# Patient Record
Sex: Male | Born: 1980 | Race: White | Hispanic: No | Marital: Married | Smoking: Never smoker
Health system: Southern US, Community
[De-identification: ages and names within clinical notes are randomized; demographics above are authoritative.]

## PROBLEM LIST (undated history)

## (undated) DIAGNOSIS — N289 Disorder of kidney and ureter, unspecified: Secondary | ICD-10-CM

## (undated) DIAGNOSIS — Z87442 Personal history of urinary calculi: Secondary | ICD-10-CM

## (undated) HISTORY — PX: WISDOM TOOTH EXTRACTION: SHX21

## (undated) HISTORY — PX: TONSILLECTOMY: SUR1361

---
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
## 2007-01-19 ENCOUNTER — Emergency Department (HOSPITAL_COMMUNITY): Admission: EM | Admit: 2007-01-19 | Discharge: 2007-01-19 | Payer: Self-pay | Admitting: Emergency Medicine

## 2009-04-11 ENCOUNTER — Emergency Department (HOSPITAL_COMMUNITY): Admission: EM | Admit: 2009-04-11 | Discharge: 2009-04-11 | Payer: Self-pay | Admitting: Emergency Medicine

## 2013-04-21 ENCOUNTER — Emergency Department (HOSPITAL_COMMUNITY)
Admission: EM | Admit: 2013-04-21 | Discharge: 2013-04-21 | Disposition: A | Discharge status: Home / Self Care | Payer: 59 | Attending: Emergency Medicine | Admitting: Emergency Medicine

## 2013-04-21 ENCOUNTER — Encounter (HOSPITAL_COMMUNITY): Payer: Self-pay | Admitting: Emergency Medicine

## 2013-04-21 DIAGNOSIS — Y9389 Activity, other specified: Secondary | ICD-10-CM | POA: Insufficient documentation

## 2013-04-21 DIAGNOSIS — L089 Local infection of the skin and subcutaneous tissue, unspecified: Secondary | ICD-10-CM | POA: Insufficient documentation

## 2013-04-21 DIAGNOSIS — L02419 Cutaneous abscess of limb, unspecified: Secondary | ICD-10-CM | POA: Insufficient documentation

## 2013-04-21 DIAGNOSIS — W57XXXA Bitten or stung by nonvenomous insect and other nonvenomous arthropods, initial encounter: Secondary | ICD-10-CM

## 2013-04-21 DIAGNOSIS — L03116 Cellulitis of left lower limb: Secondary | ICD-10-CM

## 2013-04-21 DIAGNOSIS — Y929 Unspecified place or not applicable: Secondary | ICD-10-CM | POA: Insufficient documentation

## 2013-04-21 MED ORDER — SULFAMETHOXAZOLE-TMP DS 800-160 MG PO TABS
1.0000 | ORAL_TABLET | Freq: Once | ORAL | Status: AC
  Administered 2013-04-21: 1 via ORAL
  Filled 2013-04-21: qty 1

## 2013-04-21 MED ORDER — SULFAMETHOXAZOLE-TRIMETHOPRIM 800-160 MG PO TABS: 1.0000 | ORAL_TABLET | Freq: Two times a day (BID) | ORAL | Status: DC

## 2013-04-21 MED ORDER — NAPROXEN 500 MG PO TABS: 500.0000 mg | ORAL_TABLET | Freq: Two times a day (BID) | ORAL | Status: DC

## 2013-04-21 MED ORDER — BACITRACIN ZINC 500 UNIT/GM EX OINT
TOPICAL_OINTMENT | CUTANEOUS | Status: AC
  Filled 2013-04-21: qty 0.9

## 2013-04-21 MED ORDER — NAPROXEN 250 MG PO TABS
500.0000 mg | ORAL_TABLET | Freq: Once | ORAL | Status: AC
  Administered 2013-04-21: 500 mg via ORAL
  Filled 2013-04-21: qty 2

## 2013-04-21 NOTE — ED Notes (Signed)
Pt states he believes he was bitten by a spider 3-4 days ago. Pt has bite to the left inside of his calf that is red, swollen and warm to the touch and is oozing.

## 2013-04-21 NOTE — ED Provider Notes (Signed)
CSN: [id number]     Arrival date & time 04/21/13  2036 History   First MD Initiated Contact with Patient 04/21/13 2049     Chief Complaint  Patient presents with  . Insect Bite   (Consider location/radiation/quality/duration/timing/severity/associated sxs/prior Treatment) HPI Comments: 32 year old male who states that approximately 3-4 days ago he was bitten on the medial left calf by an insect. He did not see this but over the last several days has had increased swelling and today mashed over this area causing a small amount of purulent material to come out. Over the last 24 hours he has also developed some erythema to the skin with warmth and pain with walking. He denies fevers, worse with ambulation, no medications prior to arrival. He denies a history of diabetes or frequent infections.  The history is provided by the patient and a relative.    History reviewed. No pertinent past medical history. Past Surgical History  Procedure Laterality Date  . Tonsillectomy    . Wisdom tooth extraction     History reviewed. No pertinent family history. History  Substance Use Topics  . Smoking status: Never Smoker   . Smokeless tobacco: Not on file  . Alcohol Use: No    Review of Systems  Constitutional: Negative for fever.  Skin: Positive for rash and wound.    Allergies  Review of patient's allergies indicates no known allergies.  Home Medications   Current Outpatient Rx  Name  Route  Sig  Dispense  Refill  . naproxen (NAPROSYN) 500 MG tablet   Oral   Take 1 tablet (500 mg total) by mouth 2 (two) times daily with a meal.   30 tablet   0   . sulfamethoxazole-trimethoprim (SEPTRA DS) 800-160 MG per tablet   Oral   Take 1 tablet by mouth every 12 (twelve) hours.   20 tablet   0    BP 130/75  Pulse 85  Temp(Src) 98.9 F (37.2 C)  Resp 20  Ht 6' (1.829 m)  Wt 170 lb (77.111 kg)  BMI 23.05 kg/m2  SpO2 99% Physical Exam  Nursing note and vitals  reviewed. Constitutional: He appears well-developed and well-nourished. No distress.  HENT:  Head: Normocephalic and atraumatic.  Eyes: Conjunctivae are normal. Right eye exhibits no discharge. Left eye exhibits no discharge. No scleral icterus.  Cardiovascular: Normal rate and regular rhythm.   No murmur heard. Pulmonary/Chest: Effort normal and breath sounds normal.  Musculoskeletal: He exhibits tenderness ( Tenderness over the left medial calf). He exhibits no edema.  Skin: Skin is warm and dry. Rash noted. He is not diaphoretic. There is erythema.     Central area of opening with a small amount of serous fluid, no purulence, no induration    ED Course  Procedures (including critical care time) Labs Review Labs Reviewed - No data to display Imaging Review No results found.  EKG Interpretation   None       MDM   1. Cellulitis of left lower leg   2. Insect bite    I performed a bedside ultrasound in this area over the left medial calf there is no signs of abscess or fluid collection, he appears to have expressed the purulent material from the wound but there is no longer anything in there for incision and drainage. I have encouraged him to do warm soaks, antibiotics, will start on medications here, stable for discharge with followup in 24 hours for a recheck. Nursing to outline with permanent marker  prior to discharge.   Meds given in ED:  Medications  sulfamethoxazole-trimethoprim (BACTRIM DS) 800-160 MG per tablet 1 tablet (not administered)  naproxen (NAPROSYN) tablet 500 mg (not administered)    New Prescriptions   NAPROXEN (NAPROSYN) 500 MG TABLET    Take 1 tablet (500 mg total) by mouth 2 (two) times daily with a meal.   SULFAMETHOXAZOLE-TRIMETHOPRIM (SEPTRA DS) 800-160 MG PER TABLET    Take 1 tablet by mouth every 12 (twelve) hours.        Vida Roller, MD 04/21/13 2104

## 2014-03-20 ENCOUNTER — Encounter (HOSPITAL_COMMUNITY): Payer: Self-pay | Admitting: Emergency Medicine

## 2014-03-20 ENCOUNTER — Emergency Department (HOSPITAL_COMMUNITY): Payer: 59

## 2014-03-20 ENCOUNTER — Emergency Department (HOSPITAL_COMMUNITY)
Admission: EM | Admit: 2014-03-20 | Discharge: 2014-03-20 | Disposition: A | Discharge status: Home / Self Care | Payer: 59 | Attending: Emergency Medicine | Admitting: Emergency Medicine

## 2014-03-20 DIAGNOSIS — Z87442 Personal history of urinary calculi: Secondary | ICD-10-CM | POA: Diagnosis not present

## 2014-03-20 DIAGNOSIS — N509 Disorder of male genital organs, unspecified: Secondary | ICD-10-CM | POA: Diagnosis not present

## 2014-03-20 DIAGNOSIS — R112 Nausea with vomiting, unspecified: Secondary | ICD-10-CM | POA: Diagnosis not present

## 2014-03-20 DIAGNOSIS — Z79899 Other long term (current) drug therapy: Secondary | ICD-10-CM | POA: Diagnosis not present

## 2014-03-20 DIAGNOSIS — N201 Calculus of ureter: Secondary | ICD-10-CM | POA: Diagnosis not present

## 2014-03-20 DIAGNOSIS — R109 Unspecified abdominal pain: Secondary | ICD-10-CM | POA: Insufficient documentation

## 2014-03-20 LAB — BASIC METABOLIC PANEL
Anion gap: 12 (ref 5–15)
BUN: 19 mg/dL (ref 6–23)
CHLORIDE: 104 meq/L (ref 96–112)
CO2: 25 mEq/L (ref 19–32)
Calcium: 8.9 mg/dL (ref 8.4–10.5)
Creatinine, Ser: 1.12 mg/dL (ref 0.50–1.35)
GFR calc Af Amer: >90 mL/min (ref >90)
GFR, EST NON AFRICAN AMERICAN: 86 mL/min — AB (ref >90)
GLUCOSE: 142 mg/dL — AB (ref 70–99)
POTASSIUM: 3.9 meq/L (ref 3.7–5.3)
SODIUM: 141 meq/L (ref 137–147)

## 2014-03-20 LAB — URINE MICROSCOPIC-ADD ON

## 2014-03-20 LAB — CBC WITH DIFFERENTIAL/PLATELET
Basophils Absolute: 0 10*3/uL (ref 0.0–0.1)
Basophils Relative: 0 % (ref 0–1)
Eosinophils Absolute: 0 10*3/uL (ref 0.0–0.7)
Eosinophils Relative: 0 % (ref 0–5)
HEMATOCRIT: 44 % (ref 39.0–52.0)
HEMOGLOBIN: 15.5 g/dL (ref 13.0–17.0)
LYMPHS ABS: 1.6 10*3/uL (ref 0.7–4.0)
LYMPHS PCT: 16 % (ref 12–46)
MCH: 30.4 pg (ref 26.0–34.0)
MCHC: 35.2 g/dL (ref 30.0–36.0)
MCV: 86.3 fL (ref 78.0–100.0)
MONO ABS: 0.5 10*3/uL (ref 0.1–1.0)
MONOS PCT: 5 % (ref 3–12)
NEUTROS ABS: 7.6 10*3/uL (ref 1.7–7.7)
NEUTROS PCT: 79 % — AB (ref 43–77)
Platelets: 147 10*3/uL — ABNORMAL LOW (ref 150–400)
RBC: 5.1 MIL/uL (ref 4.22–5.81)
RDW: 12.9 % (ref 11.5–15.5)
WBC: 9.7 10*3/uL (ref 4.0–10.5)

## 2014-03-20 LAB — URINALYSIS, ROUTINE W REFLEX MICROSCOPIC
BILIRUBIN URINE: NEGATIVE
GLUCOSE, UA: NEGATIVE mg/dL
Ketones, ur: NEGATIVE mg/dL
Leukocytes, UA: NEGATIVE
Nitrite: NEGATIVE
PH: 6.5 (ref 5.0–8.0)
SPECIFIC GRAVITY, URINE: 1.02 (ref 1.005–1.030)
UROBILINOGEN UA: 0.2 mg/dL (ref 0.0–1.0)

## 2014-03-20 MED ORDER — SODIUM CHLORIDE 0.9 % IV BOLUS (SEPSIS)
500.0000 mL | Freq: Once | INTRAVENOUS | Status: AC
  Administered 2014-03-20: 06:00:00 via INTRAVENOUS

## 2014-03-20 MED ORDER — ONDANSETRON 4 MG PO TBDP: 4.0000 mg | ORAL_TABLET | Freq: Three times a day (TID) | ORAL | Status: DC | PRN

## 2014-03-20 MED ORDER — OXYCODONE-ACETAMINOPHEN 5-325 MG PO TABS
2.0000 | ORAL_TABLET | Freq: Once | ORAL | Status: AC
  Administered 2014-03-20: 2 via ORAL
  Filled 2014-03-20: qty 2

## 2014-03-20 MED ORDER — HYDROMORPHONE HCL 1 MG/ML IJ SOLN
1.0000 mg | Freq: Once | INTRAMUSCULAR | Status: AC
  Administered 2014-03-20: 1 mg via INTRAVENOUS
  Filled 2014-03-20: qty 1

## 2014-03-20 MED ORDER — ONDANSETRON HCL 4 MG/2ML IJ SOLN
4.0000 mg | Freq: Once | INTRAMUSCULAR | Status: AC
  Administered 2014-03-20: 4 mg via INTRAVENOUS
  Filled 2014-03-20: qty 2

## 2014-03-20 MED ORDER — IBUPROFEN 800 MG PO TABS: 800.0000 mg | ORAL_TABLET | Freq: Three times a day (TID) | ORAL | Status: DC | PRN

## 2014-03-20 MED ORDER — TAMSULOSIN HCL 0.4 MG PO CAPS: 0.4000 mg | ORAL_CAPSULE | Freq: Every day | ORAL | Status: DC

## 2014-03-20 MED ORDER — OXYCODONE-ACETAMINOPHEN 5-325 MG PO TABS: 1.0000 | ORAL_TABLET | ORAL | Status: DC | PRN

## 2014-03-20 MED ORDER — KETOROLAC TROMETHAMINE 30 MG/ML IJ SOLN
30.0000 mg | Freq: Once | INTRAMUSCULAR | Status: AC
  Administered 2014-03-20: 30 mg via INTRAVENOUS
  Filled 2014-03-20: qty 1

## 2014-03-20 NOTE — ED Provider Notes (Signed)
8:00 AM  Assumed care from Dr. Rubin Payor. Patient is here with left flank pain. CT scan shows a 3 mm left UVJ stone. There is mild left hydroureteronephrosis. Labs are unremarkable.  Urine pending.   8:20 AM  Pt's pain is well-controlled. Urine shows no sign of infection. There is large hemoglobin and bacteria but no leukocytes or nitrites. We'll discharge home with prescription for Percocet, ibuprofen, Flomax, Zofran. We'll give urology outpatient followup information and a urine strainer. Discussed return precautions. He verbalizes understanding and is comfortable with plan.  Layla Maw Reynolds Kittel, DO 03/20/14 [phone]

## 2014-03-20 NOTE — ED Notes (Signed)
Patient states flank pain that started at 0200. Patient c/o not being able to urinate and left flank pain. Patient states history of kidney stones and feels that this is the same pain. Patient is diaphoretic and pail. Patient states vomiting PTA. A&OX4. Family at bedside.

## 2014-03-20 NOTE — Discharge Instructions (Signed)

## 2014-03-20 NOTE — ED Notes (Signed)
MD at bedside. 

## 2014-03-20 NOTE — ED Notes (Signed)
Pt alert & oriented x4, stable gait. Patient given discharge instructions, paperwork & prescription(s). Patient  instructed to stop at the registration desk to finish any additional paperwork. Patient verbalized understanding. Pt left department w/ no further questions. 

## 2014-03-20 NOTE — ED Notes (Signed)
Pt c/o left sided flank pain extending across abdomen. Denies any hematuria. C/o N/V. States it feels similar to kidney stones in the past.

## 2014-03-20 NOTE — ED Provider Notes (Signed)
CSN: [id number]     Arrival date & time 03/20/14  0516 History   First MD Initiated Contact with Patient 03/20/14 [phone]     Chief Complaint  Patient presents with  . Flank Pain     (Consider location/radiation/quality/duration/timing/severity/associated sxs/prior Treatment) Patient is a 33 y.o. male presenting with flank pain. The history is provided by the patient.  Flank Pain This is a new problem. Pertinent negatives include no chest pain, no abdominal pain, no headaches and no shortness of breath.   patient awoke with severe left flank/abdominal pain. States goes all the way down to his testicle. He has had nausea and some vomiting. No fevers. He has a history kidney stones and states this feels like that, but more severe. No diarrhea. No constipation. No penile discharge. No urinary symptoms.  Past Medical History  Diagnosis Date  . Kidney stones    Past Surgical History  Procedure Laterality Date  . Tonsillectomy    . Wisdom tooth extraction     No family history on file. History  Substance Use Topics  . Smoking status: Never Smoker   . Smokeless tobacco: Not on file  . Alcohol Use: No    Review of Systems  Constitutional: Negative for activity change and appetite change.  Eyes: Negative for pain.  Respiratory: Negative for chest tightness and shortness of breath.   Cardiovascular: Negative for chest pain and leg swelling.  Gastrointestinal: Positive for nausea and vomiting. Negative for abdominal pain and diarrhea.  Genitourinary: Positive for flank pain and testicular pain.  Musculoskeletal: Negative for back pain and neck stiffness.  Skin: Negative for rash.  Neurological: Negative for weakness, numbness and headaches.  Psychiatric/Behavioral: Negative for behavioral problems.      Allergies  Review of patient's allergies indicates no known allergies.  Home Medications   Prior to Admission medications   Medication Sig Start Date End Date Taking?  Authorizing Provider  naproxen (NAPROSYN) 500 MG tablet Take 1 tablet (500 mg total) by mouth 2 (two) times daily with a meal. 04/21/13   Vida Roller, MD  sulfamethoxazole-trimethoprim (SEPTRA DS) 800-160 MG per tablet Take 1 tablet by mouth every 12 (twelve) hours. 04/21/13   Vida Roller, MD   BP 131/91  Pulse 68  Temp(Src) 97.8 F (36.6 C) (Oral)  Resp 22  Ht  (1.778 m)  Wt 180 lb (81.647 kg)  BMI 25.83 kg/m2  SpO2 100% Physical Exam  Nursing note and vitals reviewed. Constitutional: He is oriented to person, place, and time. He appears well-developed and well-nourished.  Patient appears to be in pain  HENT:  Head: Normocephalic and atraumatic.  Eyes: EOM are normal. Pupils are equal, round, and reactive to light.  Neck: Normal range of motion. Neck supple.  Cardiovascular: Normal rate, regular rhythm and normal heart sounds.   No murmur heard. Pulmonary/Chest: Effort normal and breath sounds normal.  Abdominal: Soft. He exhibits no distension and no mass. There is tenderness. There is no rebound and no guarding.  Moderate left-sided abdominal tenderness. Some guarding. No hernias palpated  Genitourinary: Penis normal.  Mild testicular tenderness on left. No hernias palpated.  Musculoskeletal: Normal range of motion. He exhibits no edema.  Neurological: He is alert and oriented to person, place, and time. No cranial nerve deficit.  Skin: Skin is warm and dry.  Psychiatric: He has a normal mood and affect.    ED Course  Procedures (including critical care time) Labs Review Labs Reviewed  CBC WITH DIFFERENTIAL -  Abnormal; Notable for the following:    Platelets 147 (*)    Neutrophils Relative % 79 (*)    All other components within normal limits  BASIC METABOLIC PANEL - Abnormal; Notable for the following:    Glucose, Bld 142 (*)    GFR calc non Af Amer 86 (*)    All other components within normal limits  URINALYSIS, ROUTINE W REFLEX MICROSCOPIC    Imaging  Review Ct Abdomen Pelvis Wo Contrast  03/20/2014   CLINICAL DATA:  LEFT flank pain, nausea and vomiting. History kidney stones.  EXAM: CT ABDOMEN AND PELVIS WITHOUT CONTRAST  TECHNIQUE: Multidetector CT imaging of the abdomen and pelvis was performed following the standard protocol without IV contrast.  COMPARISON:  Abdominal radiograph May 14, 2008 and CT of the abdomen and pelvis February 10, 2006  FINDINGS: LUNG BASES: Included view of the lung bases are clear. The visualized heart and pericardium are unremarkable.  KIDNEYS/BLADDER: Kidneys are orthotopic, demonstrating normal size and morphology. 4 mm right interpolar, 3 mm LEFT interpolar nephrolithiasis. Mild LEFT hydroureteronephrosis to the ureterovesicular junction where a 3 mm calculus is seen. Limited assessment for renal masses on this nonenhanced examination. Urinary bladder is decompressed and otherwise unremarkable.  SOLID ORGANS: The liver, spleen, gallbladder, pancreas and adrenal glands are unremarkable for this non-contrast examination.  GASTROINTESTINAL TRACT: The stomach, small and large bowel are normal in course and caliber without inflammatory changes, the sensitivity may be decreased by lack of enteric contrast. Normal appendix.  PERITONEUM/RETROPERITONEUM: No intraperitoneal free fluid nor free air. Aortoiliac vessels are normal in course and caliber. No lymphadenopathy by CT size criteria. Internal reproductive organs are unremarkable.  SOFT TISSUES/ OSSEOUS STRUCTURES:  Nonsuspicious.  IMPRESSION: Mild LEFT hydroureteronephrosis to the level lead ureterovesicular junction where a 3 mm calculus is seen. Bilateral residual nephrolithiasis measuring up to 4 mm on the RIGHT.   Electronically Signed   By: Awilda Metro   On: 03/20/2014 06:23     EKG Interpretation None      MDM   Final diagnoses:  Left ureteral stone    Patient with left ureteral stone. History of previous stones. 3 mm at left UVJ. Pain is somewhat  improved after Dilaudid and Toradol has been added. Urinalysis pending    Juliet Rude. Rubin Payor, MD 03/20/14 [phone]

## 2014-11-17 ENCOUNTER — Emergency Department (HOSPITAL_COMMUNITY)
Admission: EM | Admit: 2014-11-17 | Discharge: 2014-11-17 | Disposition: A | Discharge status: Home / Self Care | Payer: No Typology Code available for payment source | Attending: Emergency Medicine | Admitting: Emergency Medicine

## 2014-11-17 ENCOUNTER — Encounter (HOSPITAL_COMMUNITY): Payer: Self-pay | Admitting: Emergency Medicine

## 2014-11-17 DIAGNOSIS — Z791 Long term (current) use of non-steroidal anti-inflammatories (NSAID): Secondary | ICD-10-CM | POA: Insufficient documentation

## 2014-11-17 DIAGNOSIS — Y9289 Other specified places as the place of occurrence of the external cause: Secondary | ICD-10-CM | POA: Insufficient documentation

## 2014-11-17 DIAGNOSIS — S0501XA Injury of conjunctiva and corneal abrasion without foreign body, right eye, initial encounter: Secondary | ICD-10-CM | POA: Insufficient documentation

## 2014-11-17 DIAGNOSIS — H578 Other specified disorders of eye and adnexa: Secondary | ICD-10-CM | POA: Diagnosis present

## 2014-11-17 DIAGNOSIS — H18891 Other specified disorders of cornea, right eye: Secondary | ICD-10-CM

## 2014-11-17 DIAGNOSIS — X58XXXA Exposure to other specified factors, initial encounter: Secondary | ICD-10-CM | POA: Insufficient documentation

## 2014-11-17 DIAGNOSIS — Z87442 Personal history of urinary calculi: Secondary | ICD-10-CM | POA: Diagnosis not present

## 2014-11-17 DIAGNOSIS — Y9389 Activity, other specified: Secondary | ICD-10-CM | POA: Diagnosis not present

## 2014-11-17 DIAGNOSIS — Y998 Other external cause status: Secondary | ICD-10-CM | POA: Diagnosis not present

## 2014-11-17 DIAGNOSIS — Z79899 Other long term (current) drug therapy: Secondary | ICD-10-CM | POA: Diagnosis not present

## 2014-11-17 MED ORDER — HYDROCODONE-ACETAMINOPHEN 5-325 MG PO TABS
2.0000 | ORAL_TABLET | Freq: Once | ORAL | Status: AC
  Administered 2014-11-17: 2 via ORAL
  Filled 2014-11-17: qty 2

## 2014-11-17 MED ORDER — ERYTHROMYCIN 5 MG/GM OP OINT
TOPICAL_OINTMENT | Freq: Four times a day (QID) | OPHTHALMIC | Status: DC
  Administered 2014-11-17: 03:00:00 via OPHTHALMIC
  Filled 2014-11-17: qty 3.5

## 2014-11-17 MED ORDER — HYDROCODONE-ACETAMINOPHEN 5-325 MG PO TABS: 1.0000 | ORAL_TABLET | Freq: Four times a day (QID) | ORAL | Status: DC | PRN

## 2014-11-17 MED ORDER — TETRACAINE HCL 0.5 % OP SOLN
OPHTHALMIC | Status: AC
  Filled 2014-11-17: qty 2

## 2014-11-17 MED ORDER — FLUORESCEIN SODIUM 1 MG OP STRP
ORAL_STRIP | OPHTHALMIC | Status: AC
  Filled 2014-11-17: qty 1

## 2014-11-17 NOTE — ED Notes (Signed)
Patient states he feels as if something is in right eye. Complaining of pain and redness to right eye. Also reports is having trouble closing right eye. 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

## 2014-11-17 NOTE — ED Notes (Signed)
Discharge instructions given and reviewed with patient.  Erythromycin eye ointment and Vicodin prepack given to patient.  Patient verbalized understanding of sedating effects of medication and to use ointment as directed.  Patient ambulatory; discharged home in good condition.

## 2014-11-17 NOTE — ED Provider Notes (Signed)
CSN: [id number]     Arrival date & time 11/17/14  0215 History   First MD Initiated Contact with Patient 11/17/14 0239     Chief Complaint  Patient presents with  . 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 Despite irrigation he still has a sensation of a foreign body in his eye. His right eye is red and he is having some trouble closing his right eye due to discomfort. Pain is moderate. There is some mild blurred vision.  Past Medical History  Diagnosis Date  . Kidney stones    Past Surgical History  Procedure Laterality Date  . Tonsillectomy    . Wisdom tooth extraction     History reviewed. No pertinent family history. History  Substance Use Topics  . Smoking status: Never Smoker   . Smokeless tobacco: Not on file  . Alcohol Use: No    Review of Systems  All other systems reviewed and are negative.   Allergies  Review of patient's allergies indicates no known allergies.  Home Medications   Prior to Admission medications   Medication Sig Start Date End Date Taking? Authorizing Provider  ibuprofen (ADVIL,MOTRIN) 800 MG tablet Take 1 tablet (800 mg total) by mouth every 8 (eight) hours as needed for mild pain. 03/20/14   Kristen N Ward, DO  naproxen (NAPROSYN) 500 MG tablet Take 1 tablet (500 mg total) by mouth 2 (two) times daily with a meal. 04/21/13   Eber HongBrian Miller, MD  ondansetron (ZOFRAN ODT) 4 MG disintegrating tablet Take 1 tablet (4 mg total) by mouth every 8 (eight) hours as needed for nausea or vomiting. 03/20/14   Layla MawKristen N Ward, DO  oxyCODONE-acetaminophen (PERCOCET/ROXICET) 5-325 MG per tablet Take 1-2 tablets by mouth every 4 (four) hours as needed. 03/20/14   Kristen N Ward, DO  sulfamethoxazole-trimethoprim (SEPTRA DS) 800-160 MG per tablet Take 1 tablet by mouth every 12 (twelve)  hours. 04/21/13   Eber HongBrian Miller, MD  tamsulosin (FLOMAX) 0.4 MG CAPS capsule Take 1 capsule (0.4 mg total) by mouth daily. 03/20/14   Kristen N Ward, DO   BP 125/87 mmHg  Pulse 75  Temp(Src) 98.3 F (36.8 C) (Oral)  Resp 20  Ht 6' (1.829 m)  Wt 185 lb (83.915 kg)  BMI 25.08 kg/m2  SpO2 99%   Physical Exam  General: Well-developed, well-nourished male in no acute distress; appearance consistent with age of record HENT: normocephalic; atraumatic Eyes: pupils equal, round and reactive to light; extraocular muscles intact; right conjunctival injection; corneal defect consistent with removed foreign body seen inferior to the pupil, rust ring remains Neck: supple Heart: regular rate and rhythm Lungs: Normal respiratory effort and excursion Abdomen: soft; nondistended Extremities: No deformity; full range of motion Neurologic: Awake, alert and oriented; motor function intact in all extremities and symmetric; no facial droop Skin: Warm and dry Psychiatric: Normal mood and affect    ED Course  Procedures (including critical care time)   MDM  We'll treat patient symptomatically and refer to ophthalmology. He was advised that he needs to have the rust ring removed by a specialist.    Paula LibraJohn Marijke Guadiana, MD 11/17/14 [phone]

## 2014-11-19 MED FILL — Hydrocodone-Acetaminophen Tab 5-325 MG: ORAL | Qty: 6 | Status: AC

## 2016-09-09 ENCOUNTER — Encounter (HOSPITAL_COMMUNITY): Payer: Self-pay | Admitting: Emergency Medicine

## 2016-09-09 ENCOUNTER — Encounter (HOSPITAL_COMMUNITY)
Admission: EM | Disposition: A | Discharge status: Home / Self Care | Payer: Self-pay | Source: Home / Self Care | Attending: Emergency Medicine

## 2016-09-09 ENCOUNTER — Emergency Department (HOSPITAL_COMMUNITY): Payer: 59

## 2016-09-09 ENCOUNTER — Observation Stay (HOSPITAL_COMMUNITY)
Admission: EM | Admit: 2016-09-09 | Discharge: 2016-09-11 | Disposition: A | Discharge status: Home / Self Care | Payer: 59 | Attending: Urology | Admitting: Urology

## 2016-09-09 ENCOUNTER — Emergency Department (HOSPITAL_COMMUNITY): Payer: 59 | Admitting: Certified Registered"

## 2016-09-09 DIAGNOSIS — N132 Hydronephrosis with renal and ureteral calculous obstruction: Secondary | ICD-10-CM | POA: Diagnosis not present

## 2016-09-09 DIAGNOSIS — Z87442 Personal history of urinary calculi: Secondary | ICD-10-CM | POA: Insufficient documentation

## 2016-09-09 DIAGNOSIS — R3915 Urgency of urination: Secondary | ICD-10-CM | POA: Insufficient documentation

## 2016-09-09 DIAGNOSIS — R319 Hematuria, unspecified: Secondary | ICD-10-CM

## 2016-09-09 DIAGNOSIS — N201 Calculus of ureter: Secondary | ICD-10-CM | POA: Diagnosis present

## 2016-09-09 DIAGNOSIS — N390 Urinary tract infection, site not specified: Secondary | ICD-10-CM

## 2016-09-09 HISTORY — PX: CYSTOSCOPY W/ URETERAL STENT PLACEMENT: SHX1429

## 2016-09-09 LAB — BASIC METABOLIC PANEL
Anion gap: 12 (ref 5–15)
BUN: 12 mg/dL (ref 6–20)
CALCIUM: 9.5 mg/dL (ref 8.9–10.3)
CO2: 24 mmol/L (ref 22–32)
CREATININE: 1.19 mg/dL (ref 0.61–1.24)
Chloride: 102 mmol/L (ref 101–111)
GFR calc Af Amer: >60 mL/min (ref >60)
GLUCOSE: 153 mg/dL — AB (ref 65–99)
POTASSIUM: 3.2 mmol/L — AB (ref 3.5–5.1)
SODIUM: 138 mmol/L (ref 135–145)

## 2016-09-09 LAB — CBC WITH DIFFERENTIAL/PLATELET
Basophils Absolute: 0 10*3/uL (ref 0.0–0.1)
Basophils Relative: 0 %
EOS ABS: 0 10*3/uL (ref 0.0–0.7)
EOS PCT: 0 %
HCT: 44.9 % (ref 39.0–52.0)
Hemoglobin: 16 g/dL (ref 13.0–17.0)
LYMPHS ABS: 1.1 10*3/uL (ref 0.7–4.0)
Lymphocytes Relative: 6 %
MCH: 30.6 pg (ref 26.0–34.0)
MCHC: 35.6 g/dL (ref 30.0–36.0)
MCV: 85.9 fL (ref 78.0–100.0)
MONO ABS: 1 10*3/uL (ref 0.1–1.0)
Monocytes Relative: 5 %
NEUTROS PCT: 89 %
Neutro Abs: 16.8 10*3/uL — ABNORMAL HIGH (ref 1.7–7.7)
PLATELETS: 212 10*3/uL (ref 150–400)
RBC: 5.23 MIL/uL (ref 4.22–5.81)
RDW: 13.2 % (ref 11.5–15.5)
WBC: 18.9 10*3/uL — ABNORMAL HIGH (ref 4.0–10.5)

## 2016-09-09 LAB — HEPATIC FUNCTION PANEL
ALT: 17 U/L (ref 17–63)
AST: 21 U/L (ref 15–41)
Albumin: 4.8 g/dL (ref 3.5–5.0)
Alkaline Phosphatase: 55 U/L (ref 38–126)
BILIRUBIN DIRECT: 0.1 mg/dL (ref 0.1–0.5)
BILIRUBIN INDIRECT: 0.8 mg/dL (ref 0.3–0.9)
TOTAL PROTEIN: 7.1 g/dL (ref 6.5–8.1)
Total Bilirubin: 0.9 mg/dL (ref 0.3–1.2)

## 2016-09-09 LAB — URINALYSIS, MICROSCOPIC (REFLEX)

## 2016-09-09 LAB — URINALYSIS, ROUTINE W REFLEX MICROSCOPIC
GLUCOSE, UA: NEGATIVE mg/dL
Leukocytes, UA: NEGATIVE
Nitrite: NEGATIVE
PROTEIN: 30 mg/dL — AB
Specific Gravity, Urine: >1.03 — ABNORMAL HIGH (ref 1.005–1.030)
pH: 5.5 (ref 5.0–8.0)

## 2016-09-09 SURGERY — CYSTOSCOPY, WITH RETROGRADE PYELOGRAM AND URETERAL STENT INSERTION
Anesthesia: General | Site: Ureter | Laterality: Left

## 2016-09-09 MED ORDER — ONDANSETRON HCL 4 MG/2ML IJ SOLN: 4.0000 mg | Freq: Once | INTRAMUSCULAR | Status: DC | PRN

## 2016-09-09 MED ORDER — ONDANSETRON HCL 4 MG/2ML IJ SOLN
4.0000 mg | Freq: Once | INTRAMUSCULAR | Status: AC
  Administered 2016-09-09: 4 mg via INTRAVENOUS
  Filled 2016-09-09: qty 2

## 2016-09-09 MED ORDER — PROPOFOL 10 MG/ML IV BOLUS
INTRAVENOUS | Status: DC | PRN
  Administered 2016-09-09: 150 mg via INTRAVENOUS

## 2016-09-09 MED ORDER — IOPAMIDOL (ISOVUE-300) INJECTION 61%
INTRAVENOUS | Status: DC | PRN
Start: 2016-09-09 — End: 2016-09-09
  Administered 2016-09-09: 3 mL via INTRAVENOUS

## 2016-09-09 MED ORDER — IOPAMIDOL (ISOVUE-300) INJECTION 61%
100.0000 mL | Freq: Once | INTRAVENOUS | Status: AC | PRN
  Administered 2016-09-09: 100 mL via INTRAVENOUS

## 2016-09-09 MED ORDER — DEXTROSE 5 % IV SOLN
1.0000 g | Freq: Once | INTRAVENOUS | Status: AC
  Administered 2016-09-09: 1 g via INTRAVENOUS
  Filled 2016-09-09: qty 10

## 2016-09-09 MED ORDER — HYDROMORPHONE HCL 1 MG/ML IJ SOLN
0.5000 mg | Freq: Once | INTRAMUSCULAR | Status: AC
Start: 2016-09-09 — End: 2016-09-09
  Administered 2016-09-09: 0.5 mg via INTRAVENOUS
  Filled 2016-09-09: qty 1

## 2016-09-09 MED ORDER — SUCCINYLCHOLINE CHLORIDE 200 MG/10ML IV SOSY
PREFILLED_SYRINGE | INTRAVENOUS | Status: DC | PRN
  Administered 2016-09-09: 140 mg via INTRAVENOUS

## 2016-09-09 MED ORDER — ONDANSETRON HCL 4 MG/2ML IJ SOLN
INTRAMUSCULAR | Status: DC | PRN
  Administered 2016-09-09: 4 mg via INTRAVENOUS

## 2016-09-09 MED ORDER — MIDAZOLAM HCL 2 MG/2ML IJ SOLN
INTRAMUSCULAR | Status: DC | PRN
  Administered 2016-09-09: 1 mg via INTRAVENOUS

## 2016-09-09 MED ORDER — SODIUM CHLORIDE 0.9 % IV BOLUS (SEPSIS)
1000.0000 mL | Freq: Once | INTRAVENOUS | Status: AC
  Administered 2016-09-09: 1000 mL via INTRAVENOUS

## 2016-09-09 MED ORDER — MEPERIDINE HCL 50 MG/ML IJ SOLN: 6.2500 mg | INTRAMUSCULAR | Status: DC | PRN

## 2016-09-09 MED ORDER — POTASSIUM CHLORIDE 10 MEQ/100ML IV SOLN
10.0000 meq | INTRAVENOUS | Status: AC
  Administered 2016-09-09: 10 meq via INTRAVENOUS
  Filled 2016-09-09: qty 100

## 2016-09-09 MED ORDER — IOPAMIDOL (ISOVUE-300) INJECTION 61%
INTRAVENOUS | Status: AC
  Administered 2016-09-09: 15 mL
  Filled 2016-09-09: qty 30

## 2016-09-09 MED ORDER — POTASSIUM CHLORIDE CRYS ER 20 MEQ PO TBCR: 40.0000 meq | EXTENDED_RELEASE_TABLET | Freq: Once | ORAL | Status: DC

## 2016-09-09 MED ORDER — HYDROMORPHONE HCL 1 MG/ML IJ SOLN
1.0000 mg | Freq: Once | INTRAMUSCULAR | Status: AC
  Administered 2016-09-09: 1 mg via INTRAVENOUS
  Filled 2016-09-09: qty 1

## 2016-09-09 MED ORDER — POTASSIUM CHLORIDE 10 MEQ/100ML IV SOLN
10.0000 meq | INTRAVENOUS | Status: DC
  Administered 2016-09-09: 10 meq via INTRAVENOUS
  Filled 2016-09-09 (×3): qty 100

## 2016-09-09 MED ORDER — LIDOCAINE 2% (20 MG/ML) 5 ML SYRINGE
INTRAMUSCULAR | Status: DC | PRN
  Administered 2016-09-09: 100 mg via INTRAVENOUS

## 2016-09-09 MED ORDER — KETOROLAC TROMETHAMINE 30 MG/ML IJ SOLN
30.0000 mg | Freq: Once | INTRAMUSCULAR | Status: AC
  Administered 2016-09-09: 30 mg via INTRAVENOUS
  Filled 2016-09-09: qty 1

## 2016-09-09 MED ORDER — HYDROMORPHONE HCL 1 MG/ML IJ SOLN
0.2500 mg | INTRAMUSCULAR | Status: DC | PRN
  Administered 2016-09-10 (×2): 0.5 mg via INTRAVENOUS

## 2016-09-09 MED ORDER — SODIUM CHLORIDE 0.9 % IV SOLN: 30.0000 meq | Freq: Once | INTRAVENOUS | Status: DC

## 2016-09-09 SURGICAL SUPPLY — 26 items
BAG URO CATCHER STRL LF (MISCELLANEOUS) ×3 IMPLANT
BASKET DAKOTA 1.9FR 11X120 (BASKET) IMPLANT
BASKET LASER NITINOL 1.9FR (BASKET) IMPLANT
CATH FOLEY LATEX FREE 20FR (CATHETERS)
CATH FOLEY LF 20FR (CATHETERS) IMPLANT
CATH INTERMIT  6FR 70CM (CATHETERS) ×3 IMPLANT
CLOTH BEACON ORANGE TIMEOUT ST (SAFETY) ×3 IMPLANT
EXTRACTOR STONE NITINOL NGAGE (UROLOGICAL SUPPLIES) IMPLANT
FIBER LASER TRAC TIP (UROLOGICAL SUPPLIES) IMPLANT
GLOVE BIO SURGEON STRL SZ8 (GLOVE) ×3 IMPLANT
GOWN STRL REUS W/TWL XL LVL3 (GOWN DISPOSABLE) ×3 IMPLANT
GUIDEWIRE ANG ZIPWIRE 038X150 (WIRE) ×3 IMPLANT
GUIDEWIRE STR DUAL SENSOR (WIRE) ×3 IMPLANT
IV NS 1000ML (IV SOLUTION)
IV NS 1000ML BAXH (IV SOLUTION) IMPLANT
MANIFOLD NEPTUNE II (INSTRUMENTS) ×3 IMPLANT
PACK CYSTO (CUSTOM PROCEDURE TRAY) ×3 IMPLANT
SHEATH ACCESS URETERAL 38CM (SHEATH) IMPLANT
STENT CONTOUR 6FRX26X.038 (STENTS) IMPLANT
STENT URET 6FRX26 CONTOUR (STENTS) ×3 IMPLANT
SYR CONTROL 10ML LL (SYRINGE) IMPLANT
SYRINGE IRR TOOMEY STRL 70CC (SYRINGE) IMPLANT
TUBE FEEDING 8FR 16IN STR KANG (MISCELLANEOUS) IMPLANT
TUBING CONNECTING 10 (TUBING) ×2 IMPLANT
TUBING CONNECTING 10' (TUBING) ×1
WATER STERILE IRR 1000ML POUR (IV SOLUTION) ×6 IMPLANT

## 2016-09-09 NOTE — ED Notes (Signed)
Holding second run of K+ for transport.

## 2016-09-09 NOTE — Anesthesia Procedure Notes (Signed)
Procedure Name: Intubation Date/Time: 09/09/2016 11:21 PM Performed by: Minerva EndsMIRARCHI, Tali Coster M Pre-anesthesia Checklist: Patient identified, Emergency Drugs available, Suction available and Patient being monitored Patient Re-evaluated:Patient Re-evaluated prior to inductionOxygen Delivery Method: Circle System Utilized Preoxygenation: Pre-oxygenation with 100% oxygen Intubation Type: IV induction Ventilation: Mask ventilation without difficulty Laryngoscope Size: Miller and 2 Grade View: Grade I Tube type: Oral Number of attempts: 1 Airway Equipment and Method: Stylet and Oral airway Placement Confirmation: ETT inserted through vocal cords under direct vision,  positive ETCO2 and breath sounds checked- equal and bilateral Secured at: 21 cm Tube secured with: Tape Dental Injury: Teeth and Oropharynx as per pre-operative assessment  Comments: Smooth IV induction Ossey--- intubation AM CRNA atraumatic--- teeth and mouth as preop--- bilat BS Ossey

## 2016-09-09 NOTE — ED Notes (Signed)
Report called to Aurther Lofterry, ED charge RN at [facility].

## 2016-09-09 NOTE — Op Note (Signed)
.  Preoperative diagnosis: Left ureteral stone, sepsis  Postoperative diagnosis: Same  Procedure: 1 cystoscopy 2. Left retrograde pyelography 3.  Intraoperative fluoroscopy, under one hour, with interpretation 4. Left 6 x 26 JJ stent placement  Attending: Wilkie AyePatrick Nyaisha Simao  Anesthesia: General  Estimated blood loss: None  Drains: Left 6 x 26 JJ ureteral stent without tether, 16 French foley catheter  Specimens: none  Antibiotics: rocephin  Findings: left mid ureteral stone. Moderate hydronephrosis. No masses/lesions in the bladder. Ureteral orifices in normal anatomic location.  Indications: Patient is a 36 year old male with a history of left ureteral stone and concern for sepsis.  After discussing treatment options, they decided proceed with left stent placement.  Procedure her in detail: The patient was brought to the operating room and a brief timeout was done to ensure correct patient, correct procedure, correct site.  General anesthesia was administered patient was placed in dorsal lithotomy position.  Their genitalia was then prepped and draped in usual sterile fashion.  A rigid 22 French cystoscope was passed in the urethra and the bladder.  Bladder was inspected free masses or lesions.  the ureteral orifices were in the normal orthotopic locations.  a 6 french ureteral catheter was then instilled into the left ureteral orifice.  a gentle retrograde was obtained and findings noted above.  we then placed a zip wire through the ureteral catheter and advanced up to the renal pelvis.    We then placed a 6 x 26 double-j ureteral stent over the original zip wire.  We then removed the wire and good coil was noted in the the renal pelvis under fluoroscopy and the bladder under direct vision.  A foley catheter was then placed. the bladder was then drained and this concluded the procedure which was well tolerated by patient.  Complications: None  Condition: Stable, extubated, transferred to  PACU  Plan: Patient is to be admitted for IV antibiotics. He will have his stone extraction in 2 weeks.

## 2016-09-09 NOTE — ED Notes (Signed)
Pt reports he was told he had a kidney stone that needed to be "busted", started having flank pain yesterday. Last able to void at 1400 on 09/08/16. States increased pain, pt is pale and diaphoretic.

## 2016-09-09 NOTE — ED Notes (Signed)
Pt placed on 1L [place], while pt is sleeping O2 sat dropped in the 80s. Pt is now 100% on 1L [place].

## 2016-09-09 NOTE — ED Notes (Signed)
Dr. Ronne BinningMcKenzie, urologist, at bedside.

## 2016-09-09 NOTE — H&P (Signed)
Urology Admission H&P  Chief Complaint: left flank pain  History of Present Illness: Mr Dalton Estrada is a 36yo with a hx of nephrolithiasis who presented to [facility] ER with a 12 hour history of severe sharp constant left flank pain radiating to his left testicle. He has associated nausea and vomiting. He developed dysuria, urinary urgency, urinary frequency this morning. No alleviating events. No exacerbating events.  WBC count is 18.9 CT scan shows a left 4mm distal ureteral calculus with mild to moderate hydronephrosis.   Past Medical History:  Diagnosis Date  . Kidney stones   . Kidney stones    Past Surgical History:  Procedure Laterality Date  . TONSILLECTOMY    . WISDOM TOOTH EXTRACTION      Home Medications:   (Not in a hospital admission) Allergies: No Known Allergies  History reviewed. No pertinent family history. Social History:  reports that he has never smoked. He has never used smokeless tobacco. He reports that he does not drink alcohol or use drugs.  Review of Systems  Gastrointestinal: Positive for nausea and vomiting.  Genitourinary: Positive for dysuria, flank pain, frequency and urgency.  All other systems reviewed and are negative.   Physical Exam:  Vital signs in last 24 hours: Temp:  [97.3 F (36.3 C)-98 F (36.7 C)] 98 F (36.7 C) (03/16 2206) Pulse Rate:  [67-93] 84 (03/16 2230) Resp:  [16-20] 18 (03/16 2206) BP: (117-136)/(73-93) 127/86 (03/16 2230) SpO2:  [95 %-100 %] 100 % (03/16 2230) Weight:  [77.1 kg (170 lb)] 77.1 kg (170 lb) (03/16 1500) Physical Exam  Constitutional: He is oriented to person, place, and time. He appears well-developed and well-nourished.  HENT:  Head: Normocephalic and atraumatic.  Eyes: EOM are normal. Pupils are equal, round, and reactive to light.  Neck: Normal range of motion. No thyromegaly present.  Cardiovascular: Normal rate and regular rhythm.   Respiratory: Effort normal and breath sounds normal.  GI: Soft. He  exhibits no distension. Hernia confirmed negative in the right inguinal area and confirmed negative in the left inguinal area.  Genitourinary: Testes normal and penis normal. No penile tenderness.  Musculoskeletal: Normal range of motion. He exhibits no edema.  Lymphadenopathy:       Right: No inguinal adenopathy present.       Left: No inguinal adenopathy present.  Neurological: He is alert and oriented to person, place, and time.  Skin: Skin is warm and dry.  Psychiatric: He has a normal mood and affect. His behavior is normal. Judgment and thought content normal.    Laboratory Data:  Results for orders placed or performed during the hospital encounter of 09/09/16 (from the past 24 hour(s))  Urinalysis, Routine w reflex microscopic     Status: Abnormal   Collection Time: 09/09/16  3:04 PM  Result Value Ref Range   Color, Urine YELLOW YELLOW   APPearance TURBID (A) CLEAR   Specific Gravity, Urine >1.030 (H) 1.005 - 1.030   pH 5.5 5.0 - 8.0   Glucose, UA NEGATIVE NEGATIVE mg/dL   Hgb urine dipstick LARGE (A) NEGATIVE   Bilirubin Urine SMALL (A) NEGATIVE   Ketones, ur TRACE (A) NEGATIVE mg/dL   Protein, ur 30 (A) NEGATIVE mg/dL   Nitrite NEGATIVE NEGATIVE   Leukocytes, UA NEGATIVE NEGATIVE  Urinalysis, Microscopic (reflex)     Status: Abnormal   Collection Time: 09/09/16  3:04 PM  Result Value Ref Range   RBC / HPF TOO NUMEROUS TO COUNT 0 - 5 RBC/hpf   WBC,  UA 6-30 0 - 5 WBC/hpf   Bacteria, UA MANY (A) NONE SEEN   Squamous Epithelial / LPF 0-5 (A) NONE SEEN  CBC with Differential     Status: Abnormal   Collection Time: 09/09/16  3:33 PM  Result Value Ref Range   WBC 18.9 (H) 4.0 - 10.5 K/uL   RBC 5.23 4.22 - 5.81 MIL/uL   Hemoglobin 16.0 13.0 - 17.0 g/dL   HCT 44.9 39.0 - 52.0 %   MCV 85.9 78.0 - 100.0 fL   MCH 30.6 26.0 - 34.0 pg   MCHC 35.6 30.0 - 36.0 g/dL   RDW 13.2 11.5 - 15.5 %   Platelets 212 150 - 400 K/uL   Neutrophils Relative % 89 %   Neutro Abs 16.8 (H) 1.7 -  7.7 K/uL   Lymphocytes Relative 6 %   Lymphs Abs 1.1 0.7 - 4.0 K/uL   Monocytes Relative 5 %   Monocytes Absolute 1.0 0.1 - 1.0 K/uL   Eosinophils Relative 0 %   Eosinophils Absolute 0.0 0.0 - 0.7 K/uL   Basophils Relative 0 %   Basophils Absolute 0.0 0.0 - 0.1 K/uL  Basic metabolic panel     Status: Abnormal   Collection Time: 09/09/16  3:33 PM  Result Value Ref Range   Sodium 138 135 - 145 mmol/L   Potassium 3.2 (L) 3.5 - 5.1 mmol/L   Chloride 102 101 - 111 mmol/L   CO2 24 22 - 32 mmol/L   Glucose, Bld 153 (H) 65 - 99 mg/dL   BUN 12 6 - 20 mg/dL   Creatinine, Ser 1.19 0.61 - 1.24 mg/dL   Calcium 9.5 8.9 - 10.3 mg/dL   GFR calc non Af Amer >60 >60 mL/min   GFR calc Af Amer >60 >60 mL/min   Anion gap 12 5 - 15  Hepatic function panel     Status: None   Collection Time: 09/09/16  3:33 PM  Result Value Ref Range   Total Protein 7.1 6.5 - 8.1 g/dL   Albumin 4.8 3.5 - 5.0 g/dL   AST 21 15 - 41 U/L   ALT 17 17 - 63 U/L   Alkaline Phosphatase 55 38 - 126 U/L   Total Bilirubin 0.9 0.3 - 1.2 mg/dL   Bilirubin, Direct 0.1 0.1 - 0.5 mg/dL   Indirect Bilirubin 0.8 0.3 - 0.9 mg/dL   No results found for this or any previous visit (from the past 240 hour(s)). Creatinine:  Recent Labs  09/09/16 1533  CREATININE 1.19   Baseline Creatinine: unknown  Impression/Assessment:  36yo with left ureteral calculus and sepsis from a urinary source  Plan:  1. The risks/benefits/alternatives to left ureteral stent placement was explained to the patient and he understands and wishes to proceed with surgery. 2. He will be admitted for IV antibiotics post operatively  Nikkol Pai 09/09/2016, 10:45 PM      

## 2016-09-09 NOTE — ED Provider Notes (Signed)
[facility]-EMER[facility]ENCY DEPT Provider Note   CSN: [id number] Arrival date & time: 09/09/16  1452     History   Chief Complaint Chief Complaint  Patient presents with  . Urinary Retention    HPI Dalton Estrada is a 36 y.o. male.  HPI Presents with acute onset right flank pain radiating to his right groin. Associated with nausea and one episode of vomiting. Pain started at 11 AM. Patient took 2 oxycodone at home with no relief. States he's having difficulty urinating. Previous history of kidney stones and states symptoms feel similar. No fever or chills. Past Medical History:  Diagnosis Date  . Kidney stones   . Kidney stones     There are no active problems to display for this patient.   Past Surgical History:  Procedure Laterality Date  . TONSILLECTOMY    . WISDOM TOOTH EXTRACTION         Home Medications    Prior to Admission medications   Medication Sig Start Date End Date Taking? Authorizing Provider  oxyCODONE-acetaminophen (PERCOCET/ROXICET) 5-325 MG per tablet Take 1-2 tablets by mouth every 4 (four) hours as needed. 03/20/14  Yes Kristen N Ward, DO    Family History History reviewed. No pertinent family history.  Social History Social History  Substance Use Topics  . Smoking status: Never Smoker  . Smokeless tobacco: Never Used  . Alcohol use No     Allergies   Patient has no known allergies.   Review of Systems Review of Systems  Constitutional: Negative for chills and fever.  Respiratory: Negative for cough and shortness of breath.   Cardiovascular: Negative for chest pain.  Gastrointestinal: Positive for abdominal pain, nausea and vomiting. Negative for constipation and diarrhea.  Genitourinary: Positive for difficulty urinating, flank pain and testicular pain. Negative for frequency, hematuria and scrotal swelling.  Musculoskeletal: Positive for back pain. Negative for neck pain and neck stiffness.  Skin: Negative for rash and wound.    Neurological: Negative for dizziness, weakness, light-headedness, numbness and headaches.  All other systems reviewed and are negative.    Physical Exam Updated Vital Signs BP 126/84   Pulse 80   Temp 97.3 F (36.3 C) (Oral)   Resp 16   Ht 6' (1.829 m)   Wt 170 lb (77.1 kg)   SpO2 99%   BMI 23.06 kg/m   Physical Exam  Constitutional: He is oriented to person, place, and time. He appears well-developed and well-nourished.  Appears uncomfortable  HENT:  Head: Normocephalic and atraumatic.  Mouth/Throat: Oropharynx is clear and moist.  Eyes: EOM are normal. Pupils are equal, round, and reactive to light.  Neck: Normal range of motion. Neck supple.  Cardiovascular: Normal rate and regular rhythm.  Exam reveals no gallop and no friction rub.   No murmur heard. Pulmonary/Chest: Effort normal and breath sounds normal. No respiratory distress. He has no wheezes. He has no rales. He exhibits no tenderness.  Abdominal: Soft. Bowel sounds are normal. There is tenderness. There is no rebound and no guarding.  Diffuse abdominal tenderness without guarding or rebound.  Genitourinary: Penis normal.  Genitourinary Comments: Right-sided testicular tenderness to palpation. Testicles are in a normal lie. There is no obvious swelling or deformity. No masses  Musculoskeletal: Normal range of motion. He exhibits tenderness. He exhibits no edema.  Right-sided CVA tenderness.   Neurological: He is alert and oriented to person, place, and time.  Moving all extremities without deficit. Sensation fully intact.  Skin: Skin is warm and dry.  Capillary refill takes less than 2 seconds. No rash noted. No erythema.  Psychiatric: He has a normal mood and affect. His behavior is normal.  Nursing note and vitals reviewed.    ED Treatments / Results  Labs (all labs ordered are listed, but only abnormal results are displayed) Labs Reviewed  CBC WITH DIFFERENTIAL/PLATELET - Abnormal; Notable for the  following:       Result Value   WBC 18.9 (*)    Neutro Abs 16.8 (*)    All other components within normal limits  BASIC METABOLIC PANEL - Abnormal; Notable for the following:    Potassium 3.2 (*)    Glucose, Bld 153 (*)    All other components within normal limits  URINALYSIS, ROUTINE W REFLEX MICROSCOPIC - Abnormal; Notable for the following:    APPearance TURBID (*)    Specific Gravity, Urine >1.030 (*)    Hgb urine dipstick LARGE (*)    Bilirubin Urine SMALL (*)    Ketones, ur TRACE (*)    Protein, ur 30 (*)    All other components within normal limits  URINALYSIS, MICROSCOPIC (REFLEX) - Abnormal; Notable for the following:    Bacteria, UA MANY (*)    Squamous Epithelial / LPF 0-5 (*)    All other components within normal limits  URINE CULTURE  HEPATIC FUNCTION PANEL    EKG  EKG Interpretation None       Radiology Ct Abdomen Pelvis W Contrast  Result Date: 09/09/2016 CLINICAL DATA:  Left flank pain.  History of nephrolithiasis. EXAM: CT ABDOMEN AND PELVIS WITH CONTRAST TECHNIQUE: Multidetector CT imaging of the abdomen and pelvis was performed using the standard protocol following bolus administration of intravenous contrast. CONTRAST:  1 ISOVUE-300 IOPAMIDOL (ISOVUE-300) INJECTION 61%, 100mL ISOVUE-300 IOPAMIDOL (ISOVUE-300) INJECTION 61% COMPARISON:  Renal ultrasound obtained earlier today. Abdomen and pelvis CT dated 03/20/2014. FINDINGS: Lower chest: Minimal right lower lobe pleural and parenchymal scarring. Hepatobiliary: No focal liver abnormality is seen. No gallstones, gallbladder wall thickening, or biliary dilatation. Pancreas: Unremarkable. No pancreatic ductal dilatation or surrounding inflammatory changes. Spleen: Normal in size without focal abnormality. Adrenals/Urinary Tract: Mild dilatation of the left renal collecting system and ureter to the level of a distal left ureteral calculus at the ureterovesical junction. The calculus measures 4 mm in maximum diameter.  Foley catheter in the urinary bladder with no urine in the bladder and no bladder calculi seen. 4 mm mid right renal calculus and adjacent smaller calculus. No right ureteral calculi or hydronephrosis. Normal appearing adrenal glands. Stomach/Bowel: Unremarkable stomach, small bowel and colon. No evidence of appendicitis. Vascular/Lymphatic: No significant vascular findings are present. No enlarged abdominal or pelvic lymph nodes. Reproductive: Mildly enlarged prostate gland. Other: None. Musculoskeletal: Lower thoracic spine degenerative changes. IMPRESSION: 1. 4 mm distal left ureteral calculus at the ureterovesical junction, causing mild left hydronephrosis and hydroureter. 2. 2 small, nonobstructing right renal calculi. Electronically Signed   By: Beckie SaltsSteven  Reid M.D.   On: 09/09/2016 18:26   [place] Renal  Result Date: 09/09/2016 CLINICAL DATA:  Initial evaluation for acute right flank pain. History of nephrolithiasis. EXAM: RENAL / URINARY TRACT ULTRASOUND COMPLETE COMPARISON:  Prior CT 03/20/2014. FINDINGS: Right Kidney: Length: 10.4 cm. Echogenicity within normal limits. No mass or hydronephrosis visualized. Left Kidney: Length: 11.9 cm. Echogenicity within normal limits. No mass or hydronephrosis visualized. Bladder: Appears normal for degree of bladder distention. IMPRESSION: Normal renal ultrasound.  No nephrolithiasis or hydronephrosis. Electronically Signed   By: Janell QuietBenjamin  McClintock M.D.  On: 09/09/2016 15:57    Procedures Procedures (including critical care time)  Medications Ordered in ED Medications  potassium chloride 10 mEq in 100 mL IVPB (not administered)  HYDROmorphone (DILAUDID) injection 1 mg (1 mg Intravenous Given 09/09/16 1530)  ketorolac (TORADOL) 30 MG/ML injection 30 mg (30 mg Intravenous Given 09/09/16 1529)  ondansetron (ZOFRAN) injection 4 mg (4 mg Intravenous Given 09/09/16 1529)  sodium chloride 0.9 % bolus 1,000 mL (0 mLs Intravenous Stopped 09/09/16 1729)  iopamidol  (ISOVUE-300) 61 % injection (15 mLs  Contrast Given 09/09/16 1734)  iopamidol (ISOVUE-300) 61 % injection 100 mL (100 mLs Intravenous Contrast Given 09/09/16 1735)  HYDROmorphone (DILAUDID) injection 0.5 mg (0.5 mg Intravenous Given 09/09/16 1809)  cefTRIAXone (ROCEPHIN) 1 g in dextrose 5 % 50 mL IVPB (1 g Intravenous New Bag/Given 09/09/16 1837)     Initial Impression / Assessment and Plan / ED Course  I have reviewed the triage vital signs and the nursing notes.  Pertinent labs & imaging results that were available during my care of the patient were reviewed by me and considered in my medical decision making (see chart for details).     Patient states pain is significantly improved after medication. Repeat exam demonstrates right upper quadrant and right flank pain as well as suprapubic tenderness to palpation. No testicular swelling or tenderness this time. Normal renal ultrasound. Given his ongoing tenderness and elevation in white blood cell count will get CT abdomen/pelvis.  CT with left sided 4 mm ureteral stone and hydronephrosis. Discussed with Dr. Ferd Hibbs. Recommends urine culture, IV Rocephin and transfer to was along emergent department for evaluation for possible stent placement. Patient will be kept NPO. Vital signs remained stable. We'll also give IV potassium replacement. Dr. Erma Heritage will accept patient in transfer. Final Clinical Impressions(s) / ED Diagnoses   Final diagnoses:  Ureteral stone with hydronephrosis  Urinary tract infection with hematuria, site unspecified    New Prescriptions New Prescriptions   No medications on file     Loren Racer, MD 09/09/16 1926

## 2016-09-09 NOTE — ED Triage Notes (Signed)
Pt has been unable to void since 2pm yesterday. Started having pain to right groin and back around 11am. Took oxycodone x 2 hrs ago with no relief. Pt pale/gray. Moaning and holding groin area.

## 2016-09-09 NOTE — ED Notes (Signed)
Pt transferred from [facility]-ED for further management of his kidney stones---- arrived per EMS stretcher.  Pt has had one emesis en route--- was given Zofran 4 mg IV.   Pt A/Ox4, with report of zero pain at this time.

## 2016-09-09 NOTE — Anesthesia Preprocedure Evaluation (Addendum)
Anesthesia Evaluation  Patient identified by MRN, date of birth, ID band Patient awake    Reviewed: Allergy & Precautions, NPO status , Patient's Chart, lab work & pertinent test results  Airway Mallampati: I  TM Distance: >3 FB Neck ROM: Full    Dental  (+) Dental Advisory Given   Pulmonary    Pulmonary exam normal        Cardiovascular Normal cardiovascular exam     Neuro/Psych    GI/Hepatic   Endo/Other    Renal/GU      Musculoskeletal   Abdominal   Peds  Hematology   Anesthesia Other Findings   Reproductive/Obstetrics                            Anesthesia Physical Anesthesia Plan  ASA: II and emergent  Anesthesia Plan: General   Post-op Pain Management:    Induction: Intravenous, Rapid sequence and Cricoid pressure planned  Airway Management Planned: Oral ETT  Additional Equipment:   Intra-op Plan:   Post-operative Plan: Extubation in OR  Informed Consent: I have reviewed the patients History and Physical, chart, labs and discussed the procedure including the risks, benefits and alternatives for the proposed anesthesia with the patient or authorized representative who has indicated his/her understanding and acceptance.     Plan Discussed with: CRNA and Surgeon  Anesthesia Plan Comments:         Anesthesia Quick Evaluation

## 2016-09-09 NOTE — ED Provider Notes (Signed)
Patient has arrived from [facility] for evaluation and management of infected, obstructing ureteral stone. He is HDS, well appearing on my exam. Pain controlled. No hypotension or signs of sepsis. Urology paged and pt taken to OR.   Shaune Pollackameron Azalynn Maxim, MD 09/10/16 [phone]

## 2016-09-10 LAB — CBC
HEMATOCRIT: 36.5 % — AB (ref 39.0–52.0)
HEMOGLOBIN: 12.6 g/dL — AB (ref 13.0–17.0)
MCH: 29.9 pg (ref 26.0–34.0)
MCHC: 34.5 g/dL (ref 30.0–36.0)
MCV: 86.5 fL (ref 78.0–100.0)
Platelets: 161 10*3/uL (ref 150–400)
RBC: 4.22 MIL/uL (ref 4.22–5.81)
RDW: 13.3 % (ref 11.5–15.5)
WBC: 9.9 10*3/uL (ref 4.0–10.5)

## 2016-09-10 LAB — BASIC METABOLIC PANEL
Anion gap: 5 (ref 5–15)
BUN: 11 mg/dL (ref 6–20)
CHLORIDE: 107 mmol/L (ref 101–111)
CO2: 26 mmol/L (ref 22–32)
CREATININE: 1.25 mg/dL — AB (ref 0.61–1.24)
Calcium: 8.3 mg/dL — ABNORMAL LOW (ref 8.9–10.3)
GFR calc Af Amer: >60 mL/min (ref >60)
GFR calc non Af Amer: >60 mL/min (ref >60)
GLUCOSE: 109 mg/dL — AB (ref 65–99)
POTASSIUM: 3.9 mmol/L (ref 3.5–5.1)
Sodium: 138 mmol/L (ref 135–145)

## 2016-09-10 MED ORDER — DEXTROSE 5 % IV SOLN
2.0000 g | INTRAVENOUS | Status: DC
  Administered 2016-09-10 – 2016-09-11 (×2): 2 g via INTRAVENOUS
  Filled 2016-09-10 (×2): qty 2

## 2016-09-10 MED ORDER — OXYCODONE-ACETAMINOPHEN 5-325 MG PO TABS: 1.0000 | ORAL_TABLET | ORAL | Status: DC | PRN

## 2016-09-10 MED ORDER — ZOLPIDEM TARTRATE 5 MG PO TABS: 5.0000 mg | ORAL_TABLET | Freq: Every evening | ORAL | Status: DC | PRN

## 2016-09-10 MED ORDER — BELLADONNA ALKALOIDS-OPIUM 16.2-60 MG RE SUPP: 1.0000 | Freq: Four times a day (QID) | RECTAL | Status: DC | PRN

## 2016-09-10 MED ORDER — HYDROMORPHONE HCL 1 MG/ML IJ SOLN
INTRAMUSCULAR | Status: AC
  Filled 2016-09-10: qty 1

## 2016-09-10 MED ORDER — FENTANYL CITRATE (PF) 100 MCG/2ML IJ SOLN
INTRAMUSCULAR | Status: DC | PRN
  Administered 2016-09-09: 50 ug via INTRAVENOUS

## 2016-09-10 MED ORDER — ONDANSETRON HCL 4 MG/2ML IJ SOLN
4.0000 mg | INTRAMUSCULAR | Status: DC | PRN
Start: 2016-09-10 — End: 2016-09-11

## 2016-09-10 MED ORDER — HYDROMORPHONE HCL 1 MG/ML IJ SOLN: 0.5000 mg | INTRAMUSCULAR | Status: DC | PRN

## 2016-09-10 MED ORDER — DIPHENHYDRAMINE HCL 50 MG/ML IJ SOLN: 12.5000 mg | Freq: Four times a day (QID) | INTRAMUSCULAR | Status: DC | PRN

## 2016-09-10 MED ORDER — SODIUM CHLORIDE 0.9 % IV SOLN
INTRAVENOUS | Status: DC
  Administered 2016-09-09 – 2016-09-10 (×2): via INTRAVENOUS
  Administered 2016-09-10: 1000 mL via INTRAVENOUS

## 2016-09-10 MED ORDER — DIPHENHYDRAMINE HCL 12.5 MG/5ML PO ELIX: 12.5000 mg | ORAL_SOLUTION | Freq: Four times a day (QID) | ORAL | Status: DC | PRN

## 2016-09-10 NOTE — Transfer of Care (Signed)
Immediate Anesthesia Transfer of Care Note  Patient: Dalton Estrada  Procedure(s) Performed: Procedure(s): CYSTOSCOPY WITH RETROGRADE PYELOGRAM/URETERAL STENT PLACEMENT (Left)  Patient Location: PACU  Anesthesia Type:General  Level of Consciousness: awake and alert   Airway & Oxygen Therapy: Patient Spontanous Breathing and Patient connected to face mask oxygen  Post-op Assessment: Report given to RN and Post -op Vital signs reviewed and stable  Post vital signs: Reviewed and stable  Last Vitals:  Vitals:   09/10/16 0030 09/10/16 0045  BP: 117/79 122/73  Pulse: 77 76  Resp: 12 16  Temp:  36.8 C    Last Pain:  Vitals:   09/10/16 0100  TempSrc:   PainSc: 4          Complications: No apparent anesthesia complications

## 2016-09-10 NOTE — Anesthesia Postprocedure Evaluation (Signed)
Anesthesia Post Note  Patient: Ria CommentJason R Miyazaki  Procedure(s) Performed: Procedure(s) (LRB): CYSTOSCOPY WITH RETROGRADE PYELOGRAM/URETERAL STENT PLACEMENT (Left)  Patient location during evaluation: PACU Anesthesia Type: General Level of consciousness: awake and alert Pain management: pain level controlled Vital Signs Assessment: post-procedure vital signs reviewed and stable Respiratory status: spontaneous breathing, nonlabored ventilation, respiratory function stable and patient connected to nasal cannula oxygen Cardiovascular status: blood pressure returned to baseline and stable Postop Assessment: no signs of nausea or vomiting Anesthetic complications: no       Last Vitals:  Vitals:   09/10/16 0300 09/10/16 0415  BP: 105/69 (!) 110/56  Pulse: (!) 55 (!) 59  Resp: 15 16  Temp: 36.6 C 36.8 C    Last Pain:  Vitals:   09/10/16 0815  TempSrc:   PainSc: 0-No pain                 Kvion Shapley DAVID

## 2016-09-10 NOTE — Progress Notes (Signed)
1 Day Post-Op Subjective: Patient reports urinary urgency. No flank pain. No fevers  Objective: Vital signs in last 24 hours: Temp:  [97.3 F (36.3 C)-98.9 F (37.2 C)] 98.1 F (36.7 C) (03/17 1045) Pulse Rate:  [55-98] 78 (03/17 1045) Resp:  [12-27] 20 (03/17 1045) BP: (102-136)/(48-93) 102/56 (03/17 1045) SpO2:  [95 %-100 %] 100 % (03/17 1045) Weight:  [77.1 kg (170 lb)] 77.1 kg (170 lb) (03/16 1500)  Intake/Output from previous day: 03/16 0701 - 03/17 0700 In: 2379.2 [I.V.:1229.2; IV Piggyback:1150] Out: 800 [Urine:800] Intake/Output this shift: Total I/O In: 480 [P.O.:480] Out: 600 [Urine:600]  Physical Exam:  General:alert, cooperative and appears stated age GI: soft, non tender, normal bowel sounds, no palpable masses, no organomegaly, no inguinal hernia Male genitalia: not done Extremities: extremities normal, atraumatic, no cyanosis or edema  Lab Results:  Recent Labs  09/09/16 1533 09/10/16 0101  HGB 16.0 12.6*  HCT 44.9 36.5*   BMET  Recent Labs  09/09/16 1533 09/10/16 0101  NA 138 138  K 3.2* 3.9  CL 102 107  CO2 24 26  GLUCOSE 153* 109*  BUN 12 11  CREATININE 1.19 1.25*  CALCIUM 9.5 8.3*   No results for input(s): LABPT, INR in the last 72 hours. No results for input(s): LABURIN in the last 72 hours. No results found for this or any previous visit.  Studies/Results: Ct Abdomen Pelvis W Contrast  Result Date: 09/09/2016 CLINICAL DATA:  Left flank pain.  History of nephrolithiasis. EXAM: CT ABDOMEN AND PELVIS WITH CONTRAST TECHNIQUE: Multidetector CT imaging of the abdomen and pelvis was performed using the standard protocol following bolus administration of intravenous contrast. CONTRAST:  1 ISOVUE-300 IOPAMIDOL (ISOVUE-300) INJECTION 61%, 100mL ISOVUE-300 IOPAMIDOL (ISOVUE-300) INJECTION 61% COMPARISON:  Renal ultrasound obtained earlier today. Abdomen and pelvis CT dated 03/20/2014. FINDINGS: Lower chest: Minimal right lower lobe pleural and  parenchymal scarring. Hepatobiliary: No focal liver abnormality is seen. No gallstones, gallbladder wall thickening, or biliary dilatation. Pancreas: Unremarkable. No pancreatic ductal dilatation or surrounding inflammatory changes. Spleen: Normal in size without focal abnormality. Adrenals/Urinary Tract: Mild dilatation of the left renal collecting system and ureter to the level of a distal left ureteral calculus at the ureterovesical junction. The calculus measures 4 mm in maximum diameter. Foley catheter in the urinary bladder with no urine in the bladder and no bladder calculi seen. 4 mm mid right renal calculus and adjacent smaller calculus. No right ureteral calculi or hydronephrosis. Normal appearing adrenal glands. Stomach/Bowel: Unremarkable stomach, small bowel and colon. No evidence of appendicitis. Vascular/Lymphatic: No significant vascular findings are present. No enlarged abdominal or pelvic lymph nodes. Reproductive: Mildly enlarged prostate gland. Other: None. Musculoskeletal: Lower thoracic spine degenerative changes. IMPRESSION: 1. 4 mm distal left ureteral calculus at the ureterovesical junction, causing mild left hydronephrosis and hydroureter. 2. 2 small, nonobstructing right renal calculi. Electronically Signed   By: Beckie SaltsSteven  Reid M.D.   On: 09/09/2016 18:26   [place] Renal  Result Date: 09/09/2016 CLINICAL DATA:  Initial evaluation for acute right flank pain. History of nephrolithiasis. EXAM: RENAL / URINARY TRACT ULTRASOUND COMPLETE COMPARISON:  Prior CT 03/20/2014. FINDINGS: Right Kidney: Length: 10.4 cm. Echogenicity within normal limits. No mass or hydronephrosis visualized. Left Kidney: Length: 11.9 cm. Echogenicity within normal limits. No mass or hydronephrosis visualized. Bladder: Appears normal for degree of bladder distention. IMPRESSION: Normal renal ultrasound.  No nephrolithiasis or hydronephrosis. Electronically Signed   By: Rise MuBenjamin  McClintock M.D.   On: 09/09/2016 15:57     Assessment/Plan:  POD#1 left stent placement for ureteral calculus and sepsis  1. Continue rocephin pending urine culture 2. D/c foley 3. SLIVF 4. Likely discharge tomorrow morning   LOS: 0 days   Wilkie Aye 09/10/2016, 12:21 PM

## 2016-09-11 LAB — URINE CULTURE: CULTURE: NO GROWTH

## 2016-09-11 MED ORDER — OXYCODONE-ACETAMINOPHEN 5-325 MG PO TABS: 1.0000 | ORAL_TABLET | ORAL | 0 refills | Status: DC | PRN

## 2016-09-11 MED ORDER — SULFAMETHOXAZOLE-TRIMETHOPRIM 800-160 MG PO TABS: 1.0000 | ORAL_TABLET | Freq: Two times a day (BID) | ORAL | 0 refills | Status: DC

## 2016-09-11 MED ORDER — TAMSULOSIN HCL 0.4 MG PO CAPS: 0.4000 mg | ORAL_CAPSULE | Freq: Every day | ORAL | 0 refills | Status: DC

## 2016-09-11 NOTE — Progress Notes (Signed)
Nurse reviewed discharge instructions with the pt.  Pt verbalized understanding of discharge instructions, follow up appointment and new medications.  No concerns at time of discharge. Prescriptions given to pt prior to discharge.

## 2016-09-12 ENCOUNTER — Other Ambulatory Visit: Payer: Self-pay | Admitting: Urology

## 2016-09-14 ENCOUNTER — Encounter (HOSPITAL_COMMUNITY): Payer: Self-pay | Admitting: Urology

## 2016-09-16 NOTE — Patient Instructions (Signed)
Dalton Estrada  09/16/2016     @PREFPERIOPPHARMACY @   Your procedure is scheduled on 09/26/2016.  Report to [facility] at 6:40 A.M.  Call this number if you have problems the morning of surgery:  [phone]   Remember:  Do not eat food or drink liquids after midnight.  Take these medicines the morning of surgery with A SIP OF WATER Oxycodone if needed, Flomax   Do not wear jewelry, make-up or nail polish.  Do not wear lotions, powders, or perfumes, or deoderant.  Do not shave 48 hours prior to surgery.  Men may shave face and neck.  Do not bring valuables to the hospital.  [facility] is not responsible for any belongings or valuables.  Contacts, dentures or bridgework may not be worn into surgery.  Leave your suitcase in the car.  After surgery it may be brought to your room.  For patients admitted to the hospital, discharge time will be determined by your treatment team.  Patients discharged the day of surgery will not be allowed to drive home.    Please read over the following fact sheets that you were given. Surgical Site Infection Prevention and Anesthesia Post-op Instructions     PATIENT INSTRUCTIONS POST-ANESTHESIA  IMMEDIATELY FOLLOWING SURGERY:  Do not drive or operate machinery for the first twenty four hours after surgery.  Do not make any important decisions for twenty four hours after surgery or while taking narcotic pain medications or sedatives.  If you develop intractable nausea and vomiting or a severe headache please notify your doctor immediately.  FOLLOW-UP:  Please make an appointment with your surgeon as instructed. You do not need to follow up with anesthesia unless specifically instructed to do so.  WOUND CARE INSTRUCTIONS (if applicable):  Keep a dry clean dressing on the anesthesia/puncture wound site if there is drainage.  Once the wound has quit draining you may leave it open to air.  Generally you should leave the bandage intact for twenty four  hours unless there is drainage.  If the epidural site drains for more than 36-48 hours please call the anesthesia department.  QUESTIONS?:  Please feel free to call your physician or the hospital operator if you have any questions, and they will be happy to assist you.      Cystoscopy Cystoscopy is a procedure that is used to help diagnose and sometimes treat conditions that affect that lower urinary tract. The lower urinary tract includes the bladder and the tube that drains urine from the bladder out of the body (urethra). Cystoscopy is performed with a thin, tube-shaped instrument with a light and camera at the end (cystoscope). The cystoscope may be hard (rigid) or flexible, depending on the goal of the procedure.The cystoscope is inserted through the urethra, into the bladder. Cystoscopy may be recommended if you have:  Urinary tractinfections that keep coming back (recurring).  Blood in the urine (hematuria).  Loss of bladder control (urinary incontinence) or an overactive bladder.  Unusual cells found in a urine sample.  A blockage in the urethra.  Painful urination.  An abnormality in the bladder found during an intravenous pyelogram (IVP) or CT scan. Cystoscopy may also be done to remove a sample of tissue to be examined under a microscope (biopsy). Tell a health care provider about:  Any allergies you have.  All medicines you are taking, including vitamins, herbs, eye drops, creams, and over-the-counter medicines.  Any problems you or family members have had with anesthetic  medicines.  Any blood disorders you have.  Any surgeries you have had.  Any medical conditions you have.  Whether you are pregnant or may be pregnant. What are the risks? Generally, this is a safe procedure. However, problems may occur, including:  Infection.  Bleeding.  Allergic reactions to medicines.  Damage to other structures or organs. What happens before the procedure?  Ask your  health care provider about:  Changing or stopping your regular medicines. This is especially important if you are taking diabetes medicines or blood thinners.  Taking medicines such as aspirin and ibuprofen. These medicines can thin your blood. Do not take these medicines before your procedure if your health care provider instructs you not to.  Follow instructions from your health care provider about eating or drinking restrictions.  You may be given antibiotic medicine to help prevent infection.  You may have an exam or testing, such as X-rays of the bladder, urethra, or kidneys.  You may have urine tests to check for signs of infection.  Plan to have someone take you home after the procedure. What happens during the procedure?  To reduce your risk of infection,your health care team will wash or sanitize their hands.  You will be given one or more of the following:  A medicine to help you relax (sedative).  A medicine to numb the area (local anesthetic).  The area around the opening of your urethra will be cleaned.  The cystoscope will be passed through your urethra into your bladder.  Germ-free (sterile)fluid will flow through the cystoscope to fill your bladder. The fluid will stretch your bladder so that your surgeon can clearly examine your bladder walls.  The cystoscope will be removed and your bladder will be emptied. The procedure may vary among health care providers and hospitals. What happens after the procedure?  You may have some soreness or pain in your abdomen and urethra. Medicines will be available to help you.  You may have some blood in your urine.  Do not drive for 24 hours if you received a sedative. This information is not intended to replace advice given to you by your health care provider. Make sure you discuss any questions you have with your health care provider. Document Released: 06/10/2000 Document Revised: 10/22/2015 Document Reviewed:  04/30/2015 Elsevier Interactive Patient Education  2017 [employer]

## 2016-09-19 NOTE — Discharge Summary (Signed)
Physician Discharge Summary  Patient ID: Ria CommentJason R Fallas MRN: [id number] DOB/AGE: Apr 20, 1981 36 y.o.  Admit date: 09/09/2016 Discharge date: 09/11/2016  Admission Diagnoses: Ureteral calculus, sepsis Discharge Diagnoses:  Active Problems:   Ureteral calculus   Discharged Condition: good  Hospital Course: The patient tolerated the procedure well and was transferred to the floor on IV pain meds, IV fluid. On POD#1 foley was removed, pt was started on clear liquid diet and they ambulated in the halls. On POD#2 the patient was transitioned to a regular diet, IVFs were discontinued, and the patient passed flatus. Prior to discharge the pt was tolerating a regular diet, pain was controlled on PO pain meds, they were ambulating without difficulty, and they had normal bowel function.   Consults: None  Significant Diagnostic Studies: urine culture  Treatments: surgery: ureteral stent placement  Discharge Exam: Blood pressure 108/62, pulse 65, temperature 98 F (36.7 C), temperature source Oral, resp. rate 16, height 6' (1.829 m), weight 77.1 kg (170 lb), SpO2 99 %. General appearance: alert, cooperative and appears stated age Nose: Nares normal. Septum midline. Mucosa normal. No drainage or sinus tenderness. Neck: no adenopathy, no carotid bruit, no JVD, supple, symmetrical, trachea midline and thyroid not enlarged, symmetric, no tenderness/mass/nodules Resp: clear to auscultation bilaterally Cardio: regular rate and rhythm, S1, S2 normal, no murmur, click, rub or gallop GI: soft, non-tender; bowel sounds normal; no masses,  no organomegaly Extremities: extremities normal, atraumatic, no cyanosis or edema Neurologic: Grossly normal  Disposition: 01-Home or Self Care  Discharge Instructions    Discharge patient    Complete by:  As directed    Discharge disposition:  01-Home or Self Care   Discharge patient date:  09/11/2016     Allergies as of 09/11/2016   No Known Allergies      Medication List    TAKE these medications   oxyCODONE-acetaminophen 5-325 MG tablet Commonly known as:  PERCOCET/ROXICET Take 1-2 tablets by mouth every 4 (four) hours as needed.   sulfamethoxazole-trimethoprim 800-160 MG tablet Commonly known as:  BACTRIM DS,SEPTRA DS Take 1 tablet by mouth 2 (two) times daily.   tamsulosin 0.4 MG Caps capsule Commonly known as:  FLOMAX Take 1 capsule (0.4 mg total) by mouth daily after supper.        SignedWilkie Aye: Cledith Kamiya 09/19/2016, 1:57 PM

## 2016-09-20 ENCOUNTER — Encounter (HOSPITAL_COMMUNITY): Payer: Self-pay

## 2016-09-20 ENCOUNTER — Encounter (HOSPITAL_COMMUNITY)
Admission: RE | Admit: 2016-09-20 | Discharge: 2016-09-20 | Disposition: A | Discharge status: Home / Self Care | Payer: 59 | Source: Ambulatory Visit | Attending: Urology | Admitting: Urology

## 2016-09-20 DIAGNOSIS — N201 Calculus of ureter: Secondary | ICD-10-CM | POA: Diagnosis not present

## 2016-09-20 DIAGNOSIS — Z01818 Encounter for other preprocedural examination: Secondary | ICD-10-CM | POA: Diagnosis present

## 2016-09-20 HISTORY — DX: Personal history of urinary calculi: Z87.442

## 2016-09-26 ENCOUNTER — Ambulatory Visit (HOSPITAL_COMMUNITY)
Admission: RE | Admit: 2016-09-26 | Discharge: 2016-09-26 | Disposition: A | Discharge status: Home / Self Care | Payer: 59 | Source: Ambulatory Visit | Attending: Urology | Admitting: Urology

## 2016-09-26 ENCOUNTER — Ambulatory Visit (HOSPITAL_COMMUNITY): Payer: 59 | Admitting: Anesthesiology

## 2016-09-26 ENCOUNTER — Encounter (HOSPITAL_COMMUNITY): Payer: Self-pay | Admitting: *Deleted

## 2016-09-26 ENCOUNTER — Encounter (HOSPITAL_COMMUNITY)
Admission: RE | Disposition: A | Discharge status: Home / Self Care | Payer: Self-pay | Source: Ambulatory Visit | Attending: Urology

## 2016-09-26 ENCOUNTER — Ambulatory Visit (HOSPITAL_COMMUNITY): Payer: 59

## 2016-09-26 DIAGNOSIS — N132 Hydronephrosis with renal and ureteral calculous obstruction: Secondary | ICD-10-CM | POA: Diagnosis present

## 2016-09-26 DIAGNOSIS — N201 Calculus of ureter: Secondary | ICD-10-CM | POA: Diagnosis not present

## 2016-09-26 DIAGNOSIS — Z87442 Personal history of urinary calculi: Secondary | ICD-10-CM | POA: Diagnosis not present

## 2016-09-26 HISTORY — PX: CYSTOSCOPY WITH RETROGRADE PYELOGRAM, URETEROSCOPY AND STENT PLACEMENT: SHX5789

## 2016-09-26 SURGERY — CYSTOURETEROSCOPY, WITH RETROGRADE PYELOGRAM AND STENT INSERTION
Anesthesia: General | Laterality: Left

## 2016-09-26 MED ORDER — DIATRIZOATE MEGLUMINE 30 % UR SOLN
URETHRAL | Status: AC
  Filled 2016-09-26: qty 300

## 2016-09-26 MED ORDER — FENTANYL CITRATE (PF) 100 MCG/2ML IJ SOLN: 25.0000 ug | INTRAMUSCULAR | Status: DC | PRN

## 2016-09-26 MED ORDER — OXYCODONE-ACETAMINOPHEN 5-325 MG PO TABS: 1.0000 | ORAL_TABLET | ORAL | 0 refills | Status: DC | PRN

## 2016-09-26 MED ORDER — ONDANSETRON HCL 4 MG/2ML IJ SOLN
4.0000 mg | Freq: Once | INTRAMUSCULAR | Status: AC
  Administered 2016-09-26: 4 mg via INTRAVENOUS

## 2016-09-26 MED ORDER — PROPOFOL 10 MG/ML IV BOLUS
INTRAVENOUS | Status: DC | PRN
  Administered 2016-09-26: 140 mg via INTRAVENOUS

## 2016-09-26 MED ORDER — MIDAZOLAM HCL 2 MG/2ML IJ SOLN
1.0000 mg | INTRAMUSCULAR | Status: AC
  Administered 2016-09-26 (×2): 2 mg via INTRAVENOUS
  Filled 2016-09-26: qty 2

## 2016-09-26 MED ORDER — FENTANYL CITRATE (PF) 100 MCG/2ML IJ SOLN
25.0000 ug | INTRAMUSCULAR | Status: AC
  Administered 2016-09-26 (×2): 25 ug via INTRAVENOUS

## 2016-09-26 MED ORDER — LIDOCAINE HCL 1 % IJ SOLN
INTRAMUSCULAR | Status: DC | PRN
  Administered 2016-09-26: 30 mg via INTRADERMAL

## 2016-09-26 MED ORDER — PROPOFOL 10 MG/ML IV BOLUS
INTRAVENOUS | Status: AC
  Filled 2016-09-26: qty 40

## 2016-09-26 MED ORDER — FENTANYL CITRATE (PF) 100 MCG/2ML IJ SOLN
INTRAMUSCULAR | Status: DC | PRN
  Administered 2016-09-26: 50 ug via INTRAVENOUS

## 2016-09-26 MED ORDER — DIATRIZOATE MEGLUMINE 30 % UR SOLN
URETHRAL | Status: DC | PRN
  Administered 2016-09-26: 100 mL via URETHRAL

## 2016-09-26 MED ORDER — MIDAZOLAM HCL 5 MG/5ML IJ SOLN
INTRAMUSCULAR | Status: DC | PRN
  Administered 2016-09-26: 2 mg via INTRAVENOUS

## 2016-09-26 MED ORDER — MIDAZOLAM HCL 2 MG/2ML IJ SOLN
INTRAMUSCULAR | Status: AC
  Filled 2016-09-26: qty 2

## 2016-09-26 MED ORDER — FENTANYL CITRATE (PF) 100 MCG/2ML IJ SOLN
INTRAMUSCULAR | Status: AC
  Filled 2016-09-26: qty 2

## 2016-09-26 MED ORDER — DEXTROSE 5 % IV SOLN
2.0000 g | INTRAVENOUS | Status: AC
  Administered 2016-09-26: 2 g via INTRAVENOUS
  Filled 2016-09-26: qty 2

## 2016-09-26 MED ORDER — LACTATED RINGERS IV SOLN
INTRAVENOUS | Status: DC
  Administered 2016-09-26 (×2): via INTRAVENOUS

## 2016-09-26 MED ORDER — LIDOCAINE HCL 2 % EX GEL
CUTANEOUS | Status: AC
  Filled 2016-09-26: qty 10

## 2016-09-26 MED ORDER — LIDOCAINE HCL (PF) 1 % IJ SOLN
INTRAMUSCULAR | Status: AC
Start: 2016-09-26 — End: ?
  Filled 2016-09-26: qty 5

## 2016-09-26 SURGICAL SUPPLY — 26 items
BAG HAMPER (MISCELLANEOUS) ×3 IMPLANT
BAG URO CATCHER STRL LF (MISCELLANEOUS) ×3 IMPLANT
BASKET LASER NITINOL 1.9FR (BASKET) IMPLANT
BASKET ZERO TIP NITINOL 2.4FR (BASKET) IMPLANT
CATH INTERMIT  6FR 70CM (CATHETERS) IMPLANT
CLOTH BEACON ORANGE TIMEOUT ST (SAFETY) ×3 IMPLANT
EXTRACTOR STONE NITINOL NGAGE (UROLOGICAL SUPPLIES) ×3 IMPLANT
FIBER LASER FLEXIVA 200 (UROLOGICAL SUPPLIES) ×3 IMPLANT
FIBER LASER FLEXIVA 365 (UROLOGICAL SUPPLIES) IMPLANT
GLOVE BIO SURGEON STRL SZ8 (GLOVE) ×3 IMPLANT
GOWN STRL REUS W/ TWL LRG LVL3 (GOWN DISPOSABLE) ×1 IMPLANT
GOWN STRL REUS W/ TWL XL LVL3 (GOWN DISPOSABLE) ×1 IMPLANT
GOWN STRL REUS W/TWL LRG LVL3 (GOWN DISPOSABLE) ×2
GOWN STRL REUS W/TWL XL LVL3 (GOWN DISPOSABLE) ×2
GUIDEWIRE ANG ZIPWIRE 038X150 (WIRE) ×3 IMPLANT
GUIDEWIRE STR DUAL SENSOR (WIRE) IMPLANT
GUIDEWIRE STR ZIPWIRE 035X150 (MISCELLANEOUS) ×3 IMPLANT
IV NS IRRIG 3000ML ARTHROMATIC (IV SOLUTION) ×3 IMPLANT
LASER FIBER DISP (UROLOGICAL SUPPLIES) IMPLANT
MANIFOLD NEPTUNE II (INSTRUMENTS) IMPLANT
PACK CYSTO (CUSTOM PROCEDURE TRAY) ×3 IMPLANT
PAD ARMBOARD 7.5X6 YLW CONV (MISCELLANEOUS) ×3 IMPLANT
STENT URET 6FRX26 CONTOUR (STENTS) ×3 IMPLANT
SYRINGE 10CC LL (SYRINGE) ×3 IMPLANT
TOWEL OR 17X26 4PK STRL BLUE (TOWEL DISPOSABLE) ×3 IMPLANT
TUBE FEEDING 8FR 16IN STR KANG (MISCELLANEOUS) IMPLANT

## 2016-09-26 NOTE — Anesthesia Postprocedure Evaluation (Signed)
Anesthesia Post Note  Patient: Dalton Estrada  Procedure(s) Performed: Procedure(s) (LRB): CYSTOSCOPY WITH RETROGRADE PYELOGRAM, URETEROSCOPY AND BASKET STONE EXTRACTION (Left)  Patient location during evaluation: PACU Anesthesia Type: General Level of consciousness: awake and patient cooperative Pain management: pain level controlled Vital Signs Assessment: post-procedure vital signs reviewed and stable Respiratory status: spontaneous breathing, nonlabored ventilation and respiratory function stable Cardiovascular status: blood pressure returned to baseline Postop Assessment: no signs of nausea or vomiting Anesthetic complications: no     Last Vitals:  Vitals:   09/26/16 0800 09/26/16 0909  BP: 100/65 115/76  Pulse:  74  Resp: (!) 24 12  Temp:      Last Pain:  Vitals:   09/26/16 0641  TempSrc: Oral  PainSc: 2                  Mahiya Kercheval J

## 2016-09-26 NOTE — Discharge Instructions (Signed)
Ureteroscopy, Care After °This sheet gives you information about how to care for yourself after your procedure. Your health care provider may also give you more specific instructions. If you have problems or questions, contact your health care provider. °What can I expect after the procedure? °After the procedure, it is common to have: °· A burning sensation when you urinate. °· Blood in your urine. °· Mild discomfort in the bladder area or kidney area when urinating. °· Needing to urinate more often or urgently. °Follow these instructions at home: °Medicines  °· Take over-the-counter and prescription medicines only as told by your health care provider. °· If you were prescribed an antibiotic medicine, take it as told by your health care provider. Do not stop taking the antibiotic even if you start to feel better. °General instructions  ° °· Do not drive for 24 hours if you were given a medicine to help you relax (sedative) during your procedure. °· To relieve burning, try taking a warm bath or holding a warm washcloth over your groin. °· Drink enough fluid to keep your urine clear or pale yellow. °¨ Drink two 8-ounce glasses of water every hour for the first 2 hours after you get home. °¨ Continue to drink water often at home. °· You can eat what you usually do. °· Keep all follow-up visits as told by your health care provider. This is important. °¨ If you had a tube placed to keep urine flowing (ureteral stent), ask your health care provider when you need to return to have it removed. °Contact a health care provider if: °· You have chills or a fever. °· You have burning pain for longer than 24 hours after the procedure. °· You have blood in your urine for longer than 24 hours after the procedure. °Get help right away if: °· You have large amounts of blood in your urine. °· You have blood clots in your urine. °· You have very bad pain. °· You have chest pain or trouble breathing. °· You are unable to urinate and  you have the feeling of a full bladder. °This information is not intended to replace advice given to you by your health care provider. Make sure you discuss any questions you have with your health care provider. °Document Released: 06/18/2013 Document Revised: 03/29/2016 Document Reviewed: 03/25/2016 °Elsevier Interactive Patient Education © 2017 Elsevier Inc. ° °

## 2016-09-26 NOTE — Anesthesia Preprocedure Evaluation (Signed)
Anesthesia Evaluation  Patient identified by MRN, date of birth, ID band Patient awake    Reviewed: Allergy & Precautions, NPO status , Patient's Chart, lab work & pertinent test results  Airway Mallampati: I  TM Distance: >3 FB Neck ROM: Full    Dental  (+) Dental Advisory Given   Pulmonary neg pulmonary ROS,    Pulmonary exam normal        Cardiovascular negative cardio ROS Normal cardiovascular exam     Neuro/Psych    GI/Hepatic negative GI ROS,   Endo/Other    Renal/GU      Musculoskeletal   Abdominal   Peds  Hematology   Anesthesia Other Findings   Reproductive/Obstetrics                             Anesthesia Physical Anesthesia Plan  ASA: I  Anesthesia Plan: General   Post-op Pain Management:    Induction: Intravenous  Airway Management Planned: LMA  Additional Equipment:   Intra-op Plan:   Post-operative Plan: Extubation in OR  Informed Consent: I have reviewed the patients History and Physical, chart, labs and discussed the procedure including the risks, benefits and alternatives for the proposed anesthesia with the patient or authorized representative who has indicated his/her understanding and acceptance.     Plan Discussed with:   Anesthesia Plan Comments:         Anesthesia Quick Evaluation

## 2016-09-26 NOTE — Transfer of Care (Signed)
Immediate Anesthesia Transfer of Care Note  Patient: Dalton Estrada  Procedure(s) Performed: Procedure(s) with comments: CYSTOSCOPY WITH RETROGRADE PYELOGRAM, URETEROSCOPY AND BASKET STONE EXTRACTION (Left) - 30 MINUTES [phone]  [employer] [phone]  Patient Location: PACU  Anesthesia Type:General  Level of Consciousness: sedated  Airway & Oxygen Therapy: Patient Spontanous Breathing and Patient connected to face mask oxygen  Post-op Assessment: Report given to RN and Post -op Vital signs reviewed and stable  Post vital signs: Reviewed and stable  Last Vitals:  Vitals:   09/26/16 0745 09/26/16 0800  BP: 107/72 100/65  Resp: 16 (!) 24  Temp:      Last Pain:  Vitals:   09/26/16 0641  TempSrc: Oral  PainSc: 2       Patients Stated Pain Goal: 4 (09/26/16 0641)  Complications: No apparent anesthesia complications

## 2016-09-26 NOTE — Op Note (Signed)
.  Preoperative diagnosis: Left ureteral stone  Postoperative diagnosis: Same  Procedure: 1 cystoscopy 2. Left retrograde pyelography 3.  Intraoperative fluoroscopy, under one hour, with interpretation 4.  Left ureteroscopic stone manipulation with basket extraction  Attending: Cleda Mccreedy  Anesthesia: General  Estimated blood loss: None  Drains: none  Specimens: stone for analysis  Antibiotics: Rocephin  Findings: left distal ureteral stone. No hydronephrosis. No masses/lesions in the bladder. Ureteral orifices in normal anatomic location.  Indications: Patient is a 36 year old male with a history of left ureteral stone and and sepsis who is status post ureteral stent placement.  After discussing treatment options, he decided proceed with left ureteroscopic stone manipulation.  Procedure her in detail: The patient was brought to the operating room and a brief timeout was done to ensure correct patient, correct procedure, correct site.  General anesthesia was administered patient was placed in dorsal lithotomy position.  Her genitalia was then prepped and draped in usual sterile fashion.  A rigid 22 French cystoscope was passed in the urethra and the bladder.  Bladder was inspected free masses or lesions.  the ureteral orifices were in the normal orthotopic locations.  Using a grasper the left ureteral stent was brought to the urethral meatus.  we then placed a zip wire through the stent and advanced up to the renal pelvis. a 6 french ureteral catheter was then instilled into the left ureteral orifice.  a gentle retrograde was obtained and findings noted above.   we then removed the cystoscope and cannulated the left ureteral orifice with a semirigid ureteroscope.  We encountered a stone in the distal ureteral which was removed with an NGage basket.  We then performed repeat ureteroscopy up to the UPJ and noted no additional stones. Since we performed nontraumatic ureteroscopy no stent  was placed. the bladder was then drained and this concluded the procedure which was well tolerated by patient.  Complications: None  Condition: Stable, extubated, transferred to PACU  Plan: Patient is to be discharged home as to follow-up in one week.

## 2016-09-26 NOTE — H&P (View-Only) (Signed)
Urology Admission H&P  Chief Complaint: left flank pain  History of Present Illness: Dalton Estrada is a 36yo with a hx of nephrolithiasis who presented to [facility] ER with a 12 hour history of severe sharp constant left flank pain radiating to his left testicle. He has associated nausea and vomiting. He developed dysuria, urinary urgency, urinary frequency this morning. No alleviating events. No exacerbating events.  WBC count is 18.9 CT scan shows a left 4mm distal ureteral calculus with mild to moderate hydronephrosis.   Past Medical History:  Diagnosis Date  . Kidney stones   . Kidney stones    Past Surgical History:  Procedure Laterality Date  . TONSILLECTOMY    . WISDOM TOOTH EXTRACTION      Home Medications:   (Not in a hospital admission) Allergies: No Known Allergies  History reviewed. No pertinent family history. Social History:  reports that he has never smoked. He has never used smokeless tobacco. He reports that he does not drink alcohol or use drugs.  Review of Systems  Gastrointestinal: Positive for nausea and vomiting.  Genitourinary: Positive for dysuria, flank pain, frequency and urgency.  All other systems reviewed and are negative.   Physical Exam:  Vital signs in last 24 hours: Temp:  [97.3 F (36.3 C)-98 F (36.7 C)] 98 F (36.7 C) (03/16 2206) Pulse Rate:  [67-93] 84 (03/16 2230) Resp:  [16-20] 18 (03/16 2206) BP: (117-136)/(73-93) 127/86 (03/16 2230) SpO2:  [95 %-100 %] 100 % (03/16 2230) Weight:  [77.1 kg (170 lb)] 77.1 kg (170 lb) (03/16 1500) Physical Exam  Constitutional: He is oriented to person, place, and time. He appears well-developed and well-nourished.  HENT:  Head: Normocephalic and atraumatic.  Eyes: EOM are normal. Pupils are equal, round, and reactive to light.  Neck: Normal range of motion. No thyromegaly present.  Cardiovascular: Normal rate and regular rhythm.   Respiratory: Effort normal and breath sounds normal.  GI: Soft. He  exhibits no distension. Hernia confirmed negative in the right inguinal area and confirmed negative in the left inguinal area.  Genitourinary: Testes normal and penis normal. No penile tenderness.  Musculoskeletal: Normal range of motion. He exhibits no edema.  Lymphadenopathy:       Right: No inguinal adenopathy present.       Left: No inguinal adenopathy present.  Neurological: He is alert and oriented to person, place, and time.  Skin: Skin is warm and dry.  Psychiatric: He has a normal mood and affect. His behavior is normal. Judgment and thought content normal.    Laboratory Data:  Results for orders placed or performed during the hospital encounter of 09/09/16 (from the past 24 hour(s))  Urinalysis, Routine w reflex microscopic     Status: Abnormal   Collection Time: 09/09/16  3:04 PM  Result Value Ref Range   Color, Urine YELLOW YELLOW   APPearance TURBID (A) CLEAR   Specific Gravity, Urine >1.030 (H) 1.005 - 1.030   pH 5.5 5.0 - 8.0   Glucose, UA NEGATIVE NEGATIVE mg/dL   Hgb urine dipstick LARGE (A) NEGATIVE   Bilirubin Urine SMALL (A) NEGATIVE   Ketones, ur TRACE (A) NEGATIVE mg/dL   Protein, ur 30 (A) NEGATIVE mg/dL   Nitrite NEGATIVE NEGATIVE   Leukocytes, UA NEGATIVE NEGATIVE  Urinalysis, Microscopic (reflex)     Status: Abnormal   Collection Time: 09/09/16  3:04 PM  Result Value Ref Range   RBC / HPF TOO NUMEROUS TO COUNT 0 - 5 RBC/hpf   WBC,  UA 6-30 0 - 5 WBC/hpf   Bacteria, UA MANY (A) NONE SEEN   Squamous Epithelial / LPF 0-5 (A) NONE SEEN  CBC with Differential     Status: Abnormal   Collection Time: 09/09/16  3:33 PM  Result Value Ref Range   WBC 18.9 (H) 4.0 - 10.5 K/uL   RBC 5.23 4.22 - 5.81 MIL/uL   Hemoglobin 16.0 13.0 - 17.0 g/dL   HCT 16.1 09.6 - 04.5 %   MCV 85.9 78.0 - 100.0 fL   MCH 30.6 26.0 - 34.0 pg   MCHC 35.6 30.0 - 36.0 g/dL   RDW 40.9 81.1 - 91.4 %   Platelets 212 150 - 400 K/uL   Neutrophils Relative % 89 %   Neutro Abs 16.8 (H) 1.7 -  7.7 K/uL   Lymphocytes Relative 6 %   Lymphs Abs 1.1 0.7 - 4.0 K/uL   Monocytes Relative 5 %   Monocytes Absolute 1.0 0.1 - 1.0 K/uL   Eosinophils Relative 0 %   Eosinophils Absolute 0.0 0.0 - 0.7 K/uL   Basophils Relative 0 %   Basophils Absolute 0.0 0.0 - 0.1 K/uL  Basic metabolic panel     Status: Abnormal   Collection Time: 09/09/16  3:33 PM  Result Value Ref Range   Sodium 138 135 - 145 mmol/L   Potassium 3.2 (L) 3.5 - 5.1 mmol/L   Chloride 102 101 - 111 mmol/L   CO2 24 22 - 32 mmol/L   Glucose, Bld 153 (H) 65 - 99 mg/dL   BUN 12 6 - 20 mg/dL   Creatinine, Ser 7.82 0.61 - 1.24 mg/dL   Calcium 9.5 8.9 - 95.6 mg/dL   GFR calc non Af Amer >60 >60 mL/min   GFR calc Af Amer >60 >60 mL/min   Anion gap 12 5 - 15  Hepatic function panel     Status: None   Collection Time: 09/09/16  3:33 PM  Result Value Ref Range   Total Protein 7.1 6.5 - 8.1 g/dL   Albumin 4.8 3.5 - 5.0 g/dL   AST 21 15 - 41 U/L   ALT 17 17 - 63 U/L   Alkaline Phosphatase 55 38 - 126 U/L   Total Bilirubin 0.9 0.3 - 1.2 mg/dL   Bilirubin, Direct 0.1 0.1 - 0.5 mg/dL   Indirect Bilirubin 0.8 0.3 - 0.9 mg/dL   No results found for this or any previous visit (from the past 240 hour(s)). Creatinine:  Recent Labs  09/09/16 1533  CREATININE 1.19   Baseline Creatinine: unknown  Impression/Assessment:  36yo with left ureteral calculus and sepsis from a urinary source  Plan:  1. The risks/benefits/alternatives to left ureteral stent placement was explained to the patient and he understands and wishes to proceed with surgery. 2. He will be admitted for IV antibiotics post operatively  Wilkie Aye 09/09/2016, 10:45 PM

## 2016-09-26 NOTE — Interval H&P Note (Signed)
History and Physical Interval Note:  09/26/2016 8:03 AM  Dalton Estrada  has presented today for surgery, with the diagnosis of LEFT URETERAL CALCULUS  The various methods of treatment have been discussed with the patient and family. After consideration of risks, benefits and other options for treatment, the patient has consented to  Procedure(s) with comments: CYSTOSCOPY WITH RETROGRADE PYELOGRAM, URETEROSCOPY AND STENT REPLACEMENT (Left) - 30 MINUTES [phone]  [employer] [phone] HOLMIUM LASER APPLICATION (Left) as a surgical intervention .  The patient's history has been reviewed, patient examined, no change in status, stable for surgery.  I have reviewed the patient's chart and labs.  Questions were answered to the patient's satisfaction.     Wilkie Aye

## 2016-09-26 NOTE — Anesthesia Procedure Notes (Signed)
Procedure Name: LMA Insertion Date/Time: 09/26/2016 8:22 AM Performed by: Despina Hidden Pre-anesthesia Checklist: Patient identified, Patient being monitored, Emergency Drugs available, Timeout performed and Suction available Patient Re-evaluated:Patient Re-evaluated prior to inductionOxygen Delivery Method: Circle System Utilized Preoxygenation: Pre-oxygenation with 100% oxygen Intubation Type: IV induction Ventilation: Mask ventilation without difficulty LMA: LMA inserted LMA Size: 4.0 Number of attempts: 1 Placement Confirmation: positive ETCO2 and breath sounds checked- equal and bilateral Tube secured with: Tape Dental Injury: Teeth and Oropharynx as per pre-operative assessment

## 2016-09-27 ENCOUNTER — Encounter (HOSPITAL_COMMUNITY): Payer: Self-pay | Admitting: Urology

## 2016-10-05 ENCOUNTER — Ambulatory Visit (INDEPENDENT_AMBULATORY_CARE_PROVIDER_SITE_OTHER): Payer: 59 | Admitting: Urology

## 2016-10-05 DIAGNOSIS — N200 Calculus of kidney: Secondary | ICD-10-CM | POA: Diagnosis not present

## 2016-10-05 LAB — STONE ANALYSIS
Ca Oxalate,Dihydrate: 3 %
Ca Oxalate,Monohydr.: 90 %
Ca phos cry stone ql IR: 7 %
STONE WEIGHT KSTONE: 26 mg

## 2016-10-11 ENCOUNTER — Other Ambulatory Visit: Payer: Self-pay | Admitting: Urology

## 2016-10-11 DIAGNOSIS — N200 Calculus of kidney: Secondary | ICD-10-CM

## 2016-11-08 ENCOUNTER — Ambulatory Visit (HOSPITAL_COMMUNITY)
Admission: RE | Admit: 2016-11-08 | Discharge: 2016-11-08 | Disposition: A | Discharge status: Home / Self Care | Payer: Self-pay | Source: Ambulatory Visit | Attending: Urology | Admitting: Urology

## 2016-11-08 DIAGNOSIS — N200 Calculus of kidney: Secondary | ICD-10-CM

## 2016-11-08 DIAGNOSIS — Z87442 Personal history of urinary calculi: Secondary | ICD-10-CM | POA: Insufficient documentation

## 2016-11-08 DIAGNOSIS — Z09 Encounter for follow-up examination after completed treatment for conditions other than malignant neoplasm: Secondary | ICD-10-CM | POA: Insufficient documentation

## 2016-11-09 ENCOUNTER — Ambulatory Visit: Payer: Self-pay | Admitting: Urology

## 2016-12-15 ENCOUNTER — Encounter (HOSPITAL_COMMUNITY): Payer: Self-pay | Admitting: Cardiology

## 2016-12-15 ENCOUNTER — Emergency Department (HOSPITAL_COMMUNITY)
Admission: EM | Admit: 2016-12-15 | Discharge: 2016-12-16 | Disposition: A | Discharge status: Home / Self Care | Payer: Self-pay | Attending: Emergency Medicine | Admitting: Emergency Medicine

## 2016-12-15 DIAGNOSIS — N23 Unspecified renal colic: Secondary | ICD-10-CM | POA: Insufficient documentation

## 2016-12-15 HISTORY — DX: Disorder of kidney and ureter, unspecified: N28.9

## 2016-12-15 LAB — CBC
HEMATOCRIT: 46.1 % (ref 39.0–52.0)
HEMOGLOBIN: 15.9 g/dL (ref 13.0–17.0)
MCH: 30.6 pg (ref 26.0–34.0)
MCHC: 34.5 g/dL (ref 30.0–36.0)
MCV: 88.8 fL (ref 78.0–100.0)
Platelets: 202 10*3/uL (ref 150–400)
RBC: 5.19 MIL/uL (ref 4.22–5.81)
RDW: 13.1 % (ref 11.5–15.5)
WBC: 8.3 10*3/uL (ref 4.0–10.5)

## 2016-12-15 LAB — BASIC METABOLIC PANEL
ANION GAP: 11 (ref 5–15)
BUN: 15 mg/dL (ref 6–20)
CO2: 26 mmol/L (ref 22–32)
Calcium: 9.6 mg/dL (ref 8.9–10.3)
Chloride: 105 mmol/L (ref 101–111)
Creatinine, Ser: 1.38 mg/dL — ABNORMAL HIGH (ref 0.61–1.24)
GFR calc Af Amer: >60 mL/min (ref >60)
GLUCOSE: 139 mg/dL — AB (ref 65–99)
POTASSIUM: 3.7 mmol/L (ref 3.5–5.1)
Sodium: 142 mmol/L (ref 135–145)

## 2016-12-15 MED ORDER — ONDANSETRON 8 MG PO TBDP: 8.0000 mg | ORAL_TABLET | Freq: Three times a day (TID) | ORAL | 0 refills | Status: AC | PRN

## 2016-12-15 MED ORDER — SODIUM CHLORIDE 0.9 % IV BOLUS (SEPSIS)
1000.0000 mL | Freq: Once | INTRAVENOUS | Status: AC
  Administered 2016-12-15: 1000 mL via INTRAVENOUS

## 2016-12-15 MED ORDER — KETOROLAC TROMETHAMINE 30 MG/ML IJ SOLN
30.0000 mg | Freq: Once | INTRAMUSCULAR | Status: AC
  Administered 2016-12-15: 30 mg via INTRAVENOUS
  Filled 2016-12-15: qty 1

## 2016-12-15 MED ORDER — HYDROCODONE-ACETAMINOPHEN 5-325 MG PO TABS: 1.0000 | ORAL_TABLET | ORAL | 0 refills | Status: AC | PRN

## 2016-12-15 MED ORDER — HYDROMORPHONE HCL 1 MG/ML IJ SOLN
1.0000 mg | INTRAMUSCULAR | Status: AC | PRN
  Administered 2016-12-15 – 2016-12-16 (×3): 1 mg via INTRAVENOUS
  Filled 2016-12-15 (×3): qty 1

## 2016-12-15 MED ORDER — ONDANSETRON HCL 4 MG/2ML IJ SOLN
4.0000 mg | Freq: Once | INTRAMUSCULAR | Status: AC
  Administered 2016-12-15: 4 mg via INTRAVENOUS
  Filled 2016-12-15: qty 2

## 2016-12-15 MED ORDER — SODIUM CHLORIDE 0.9 % IV SOLN
INTRAVENOUS | Status: DC
  Administered 2016-12-15: 21:00:00 via INTRAVENOUS

## 2016-12-15 NOTE — ED Notes (Signed)
Checked back with patient,still can't gave a urine sample at this time,Urinal is in place,will keep check on patient for sample.

## 2016-12-15 NOTE — ED Provider Notes (Signed)
[facility]-EMERGENCY DEPT Provider Note   CSN: [id number] Arrival date & time: 12/15/16  1191     History   Chief Complaint Chief Complaint  Patient presents with  . Flank Pain    HPI Dalton Estrada is a 36 y.o. male.  HPI Patient presents to the emergency room for evaluation of acute onset of right flank pain that started about 30 minutes prior to arrival. Patient has a history of kidney stones.  Patient was recently in the hospital on March 16. Patient and up requiring ureteral stents.  On April 2 patient had left ureteroscopic stone manipulation with basket extraction. Patient was told he had additional stones but had been doing well. He missed his most recent appointment but TODAY HE HAD ACUTE ONSET OF SEVERE RIGHT-SIDED FLANK PAIN THAT FEELS VERY SIMILAR TO PREVIOUS KIDNEY STONES. He has been vomiting frequently.  The pain is severe.  No fever.  No diarrhea. Past Medical History:  Diagnosis Date  . History of kidney stones   . Renal disorder     Patient Active Problem List   Diagnosis Date Noted  . Ureteral calculus 09/09/2016    Past Surgical History:  Procedure Laterality Date  . CYSTOSCOPY W/ URETERAL STENT PLACEMENT Left 09/09/2016   Procedure: CYSTOSCOPY WITH RETROGRADE PYELOGRAM/URETERAL STENT PLACEMENT;  Surgeon: Malen Gauze, MD;  Location: [facility];  Service: Urology;  Laterality: Left;  . CYSTOSCOPY WITH RETROGRADE PYELOGRAM, URETEROSCOPY AND STENT PLACEMENT Left 09/26/2016   Procedure: CYSTOSCOPY WITH RETROGRADE PYELOGRAM, URETEROSCOPY AND BASKET STONE EXTRACTION;  Surgeon: Malen Gauze, MD;  Location: [facility];  Service: Urology;  Laterality: Left;  30 MINUTES [phone]  [employer] [phone]  . TONSILLECTOMY    . WISDOM TOOTH EXTRACTION         Home Medications    Prior to Admission medications   Medication Sig Start Date End Date Taking? Authorizing Provider  HYDROcodone-acetaminophen (NORCO/VICODIN) 5-325 MG tablet Take 1 tablet by mouth every  4 (four) hours as needed. 12/15/16   Linwood Dibbles, MD  ondansetron (ZOFRAN ODT) 8 MG disintegrating tablet Take 1 tablet (8 mg total) by mouth every 8 (eight) hours as needed for nausea or vomiting. 12/15/16   Linwood Dibbles, MD    Family History History reviewed. No pertinent family history.  Social History Social History  Substance Use Topics  . Smoking status: Never Smoker  . Smokeless tobacco: Never Used  . Alcohol use No     Allergies   Patient has no known allergies.   Review of Systems Review of Systems  All other systems reviewed and are negative.    Physical Exam Updated Vital Signs BP 112/74 (BP Location: Left Arm)   Pulse 76   Temp 97.5 F (36.4 C) (Oral)   Resp 17   Ht 1.829 m (6')   Wt 74.8 kg (165 lb)   SpO2 96%   BMI 22.38 kg/m   Physical Exam  Constitutional: He appears well-developed and well-nourished. He appears distressed.  HENT:  Head: Normocephalic and atraumatic.  Right Ear: External ear normal.  Left Ear: External ear normal.  Eyes: Conjunctivae are normal. Right eye exhibits no discharge. Left eye exhibits no discharge. No scleral icterus.  Neck: Neck supple. No tracheal deviation present.  Cardiovascular: Normal rate, regular rhythm and intact distal pulses.   Pulmonary/Chest: Effort normal and breath sounds normal. No stridor. No respiratory distress. He has no wheezes. He has no rales.  Abdominal: Soft. Bowel sounds are normal. He exhibits no distension. There  is no tenderness. There is CVA tenderness. There is no rebound and no guarding.  Vomiting clear fluid  Musculoskeletal: He exhibits no edema or tenderness.  Neurological: He is alert. He has normal strength. No cranial nerve deficit (no facial droop, extraocular movements intact, no slurred speech) or sensory deficit. He exhibits normal muscle tone. He displays no seizure activity. Coordination normal.  Skin: Skin is warm and dry. No rash noted.  Psychiatric: He has a normal mood and  affect.  Nursing note and vitals reviewed.    ED Treatments / Results  Labs (all labs ordered are listed, but only abnormal results are displayed) Labs Reviewed  BASIC METABOLIC PANEL - Abnormal; Notable for the following:       Result Value   Glucose, Bld 139 (*)    Creatinine, Ser 1.38 (*)    All other components within normal limits  CBC  URINALYSIS, ROUTINE W REFLEX MICROSCOPIC    EKG  EKG Interpretation None       Radiology No results found.  Procedures Procedures (including critical care time)  Medications Ordered in ED Medications  sodium chloride 0.9 % bolus 1,000 mL (0 mLs Intravenous Stopped 12/15/16 2020)    And  0.9 %  sodium chloride infusion ( Intravenous New Bag/Given 12/15/16 2036)  HYDROmorphone (DILAUDID) injection 1 mg (1 mg Intravenous Given 12/15/16 2036)  ondansetron (ZOFRAN) injection 4 mg (4 mg Intravenous Given 12/15/16 1919)  ketorolac (TORADOL) 30 MG/ML injection 30 mg (30 mg Intravenous Given 12/15/16 1920)     Initial Impression / Assessment and Plan / ED Course  I have reviewed the triage vital signs and the nursing notes.  Pertinent labs & imaging results that were available during my care of the patient were reviewed by me and considered in my medical decision making (see chart for details).  Clinical Course as of Dec 16 2210  Thu Dec 15, 2016  2112 DIscussed plan with patient and mother.  He does not have insurance now.  Would rather not have CT unless absolutely necessary.  Sx certainly are consistent with a kidney stone.  He has known residual stones.  Plan on empiric treatment  [JK]    Clinical Course User Index [JK] Linwood DibblesKnapp, Keanna Tugwell, MD    Pt presented to the ED with known kidney stones and recurrent renal colic.  Elevated creatinine but CBC normal.  Sx improved with treatment in the ED.  Pt has plans to follow up with his urologist. UA is pending but if without signs of infection, pt should be able to be discharged.  Will need close  outpatient follow up with urology  UA signed out to Dr Estell HarpinZammit  Final Clinical Impressions(s) / ED Diagnoses   Final diagnoses:  Renal colic    New Prescriptions New Prescriptions   HYDROCODONE-ACETAMINOPHEN (NORCO/VICODIN) 5-325 MG TABLET    Take 1 tablet by mouth every 4 (four) hours as needed.   ONDANSETRON (ZOFRAN ODT) 8 MG DISINTEGRATING TABLET    Take 1 tablet (8 mg total) by mouth every 8 (eight) hours as needed for nausea or vomiting.     Linwood DibblesKnapp, Khoury Siemon, MD 12/15/16 2212

## 2016-12-15 NOTE — ED Triage Notes (Signed)
Right flank pain times 30 minutes.

## 2016-12-15 NOTE — ED Notes (Signed)
Checked on patient still no urine at this time,urinal in place.

## 2016-12-15 NOTE — ED Notes (Signed)
Tried to get urine sample from pt,stated that he can't go right now.Will try back in about 

## 2016-12-16 LAB — URINALYSIS, ROUTINE W REFLEX MICROSCOPIC
Bilirubin Urine: NEGATIVE
GLUCOSE, UA: NEGATIVE mg/dL
Ketones, ur: 5 mg/dL — AB
LEUKOCYTES UA: NEGATIVE
Nitrite: NEGATIVE
PH: 5 (ref 5.0–8.0)
Protein, ur: 30 mg/dL — AB
SPECIFIC GRAVITY, URINE: 1.025 (ref 1.005–1.030)
SQUAMOUS EPITHELIAL / LPF: NONE SEEN

## 2016-12-16 NOTE — ED Provider Notes (Signed)
Pt left to check UA before discharge.   Results for orders placed or performed during the hospital encounter of 12/15/16  Urinalysis, Routine w reflex microscopic  Result Value Ref Range   Color, Urine YELLOW YELLOW   APPearance HAZY (A) CLEAR   Specific Gravity, Urine 1.025 1.005 - 1.030   pH 5.0 5.0 - 8.0   Glucose, UA NEGATIVE NEGATIVE mg/dL   Hgb urine dipstick LARGE (A) NEGATIVE   Bilirubin Urine NEGATIVE NEGATIVE   Ketones, ur 5 (A) NEGATIVE mg/dL   Protein, ur 30 (A) NEGATIVE mg/dL   Nitrite NEGATIVE NEGATIVE   Leukocytes, UA NEGATIVE NEGATIVE   RBC / HPF TOO NUMEROUS TO COUNT 0 - 5 RBC/hpf   WBC, UA 0-5 0 - 5 WBC/hpf   Bacteria, UA RARE (A) NONE SEEN   Squamous Epithelial / LPF NONE SEEN NONE SEEN   Mucous PRESENT    MCV 88.8 78.0 - 100.0 fL   MCH 30.6 26.0 - 34.0 pg   MCHC 34.5 30.0 - 36.0 g/dL   RDW 40.913.1 81.111.5 - 91.415.5 %   Platelets 202 150 - 400 K/uL   Laboratory interpretation all normal except hematuria  Pt discharged without antibiotics.   Devoria AlbeIva Tayquan Gassman, MD, Concha PyoFACEP      Chauncey Bruno, MD 12/16/16 [phone]

## 2017-01-11 ENCOUNTER — Ambulatory Visit (INDEPENDENT_AMBULATORY_CARE_PROVIDER_SITE_OTHER): Payer: Self-pay | Admitting: Urology

## 2017-01-11 DIAGNOSIS — N200 Calculus of kidney: Secondary | ICD-10-CM

## 2018-07-23 IMAGING — US US RENAL
1 series · 14 of 25 positions shown · non-contrast
Comparison: CT 09/09/2016.

CLINICAL DATA: Follow-up kidney stone.  Hydronephrosis on CT.

EXAM:
RENAL / URINARY TRACT ULTRASOUND COMPLETE

[Series 1: us renal · 0.23mm/px · 14 of 47 slices shown]
[im 1/47]
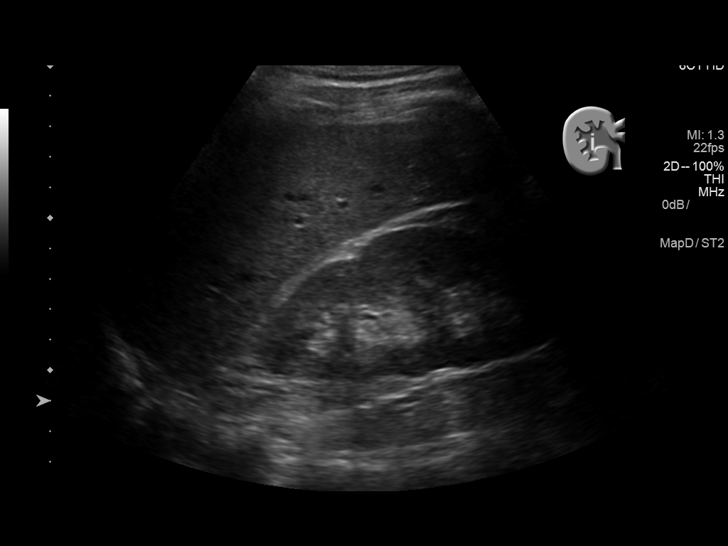
[im 4/47]
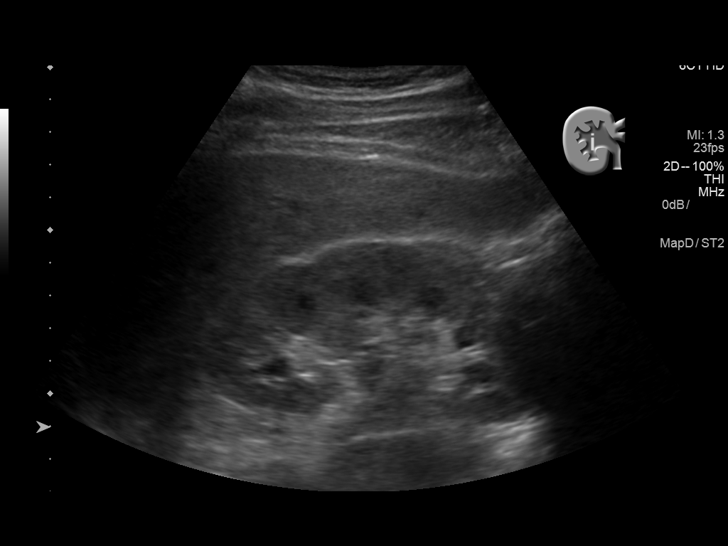
[im 8/47]
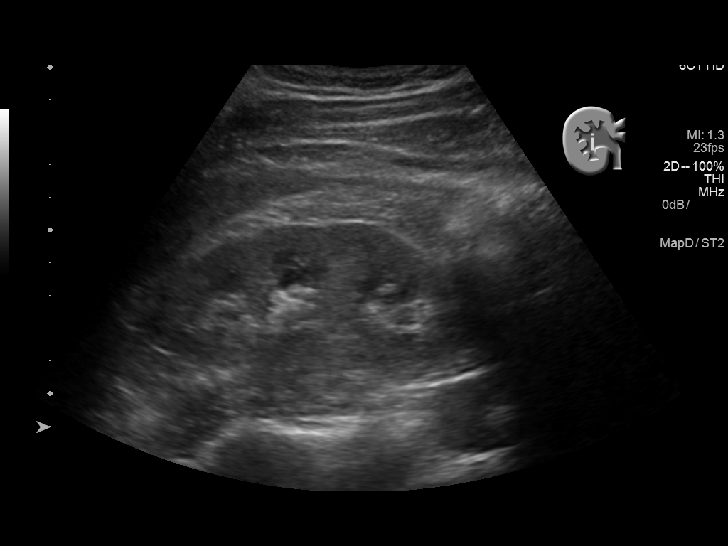
[im 12/47]
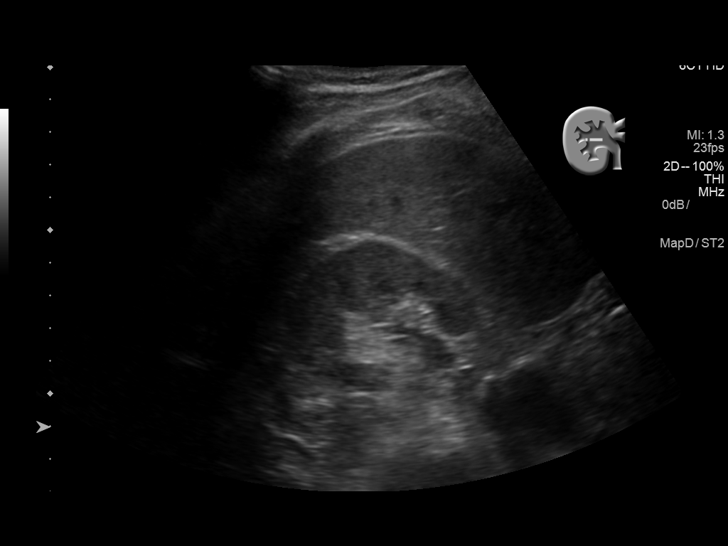
[im 16/47]
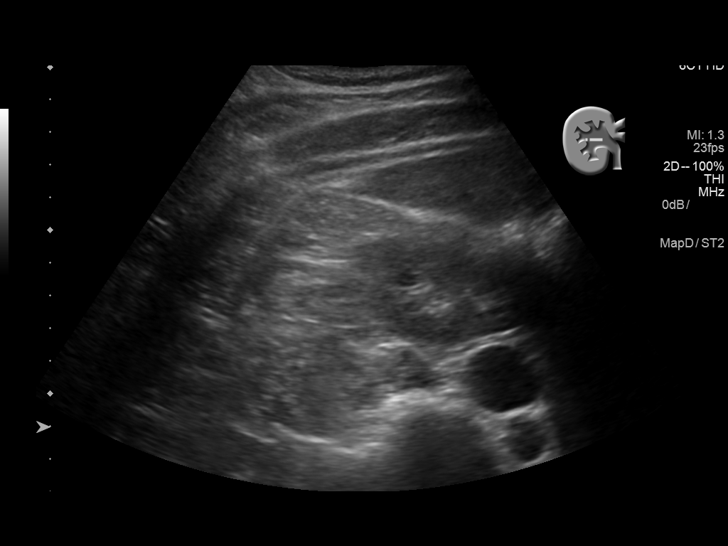
[im 18/47]
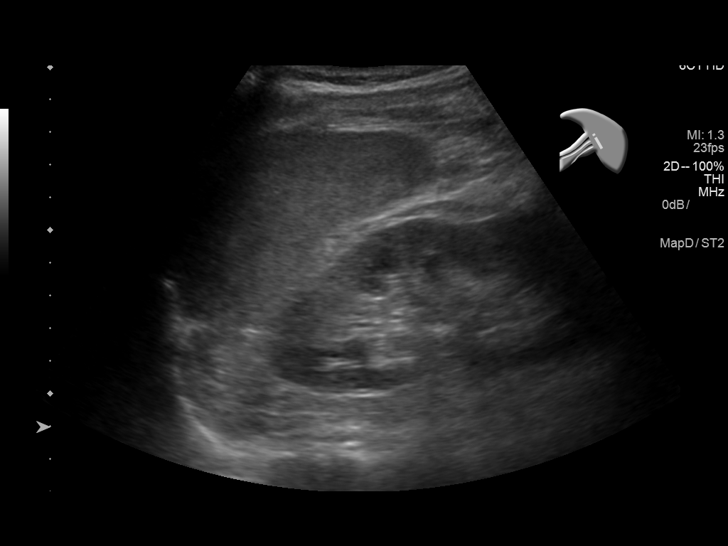
[im 22/47]
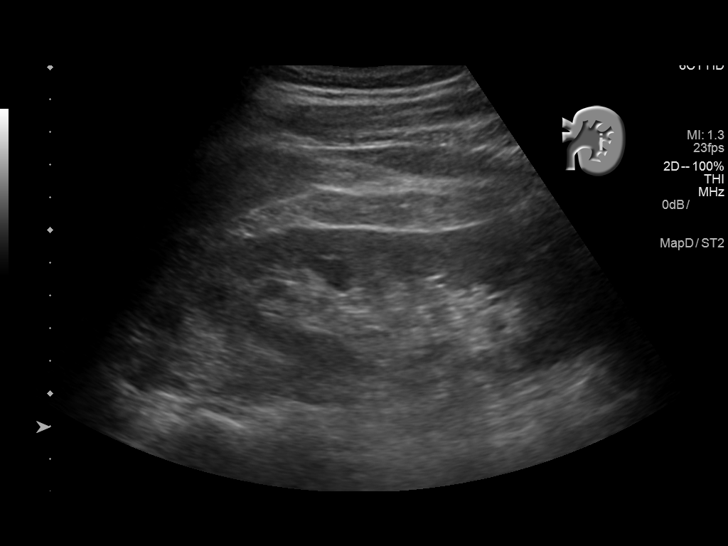
[im 25/47]
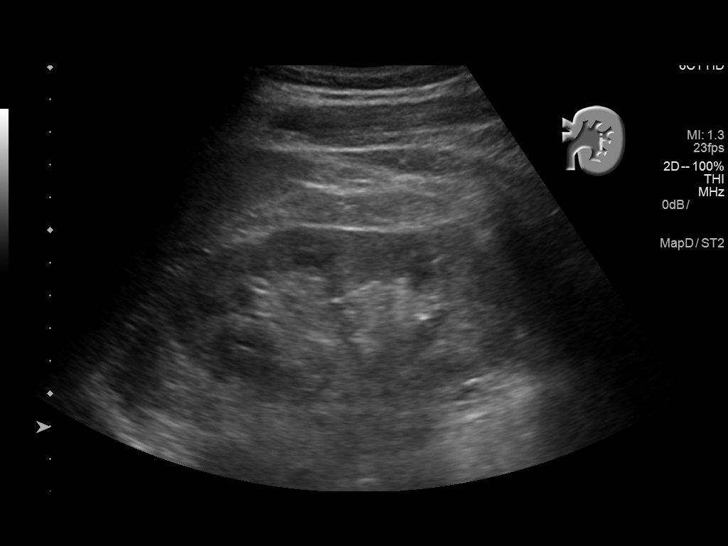
[im 29/47]
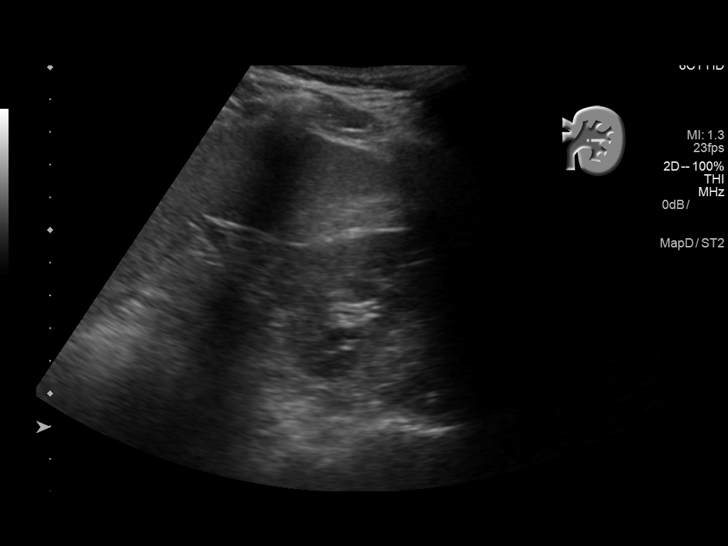
[im 31/47]
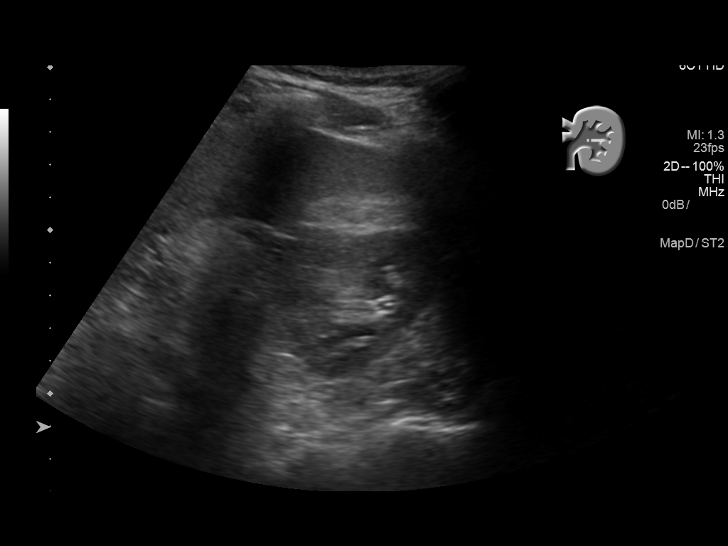
[im 35/47]
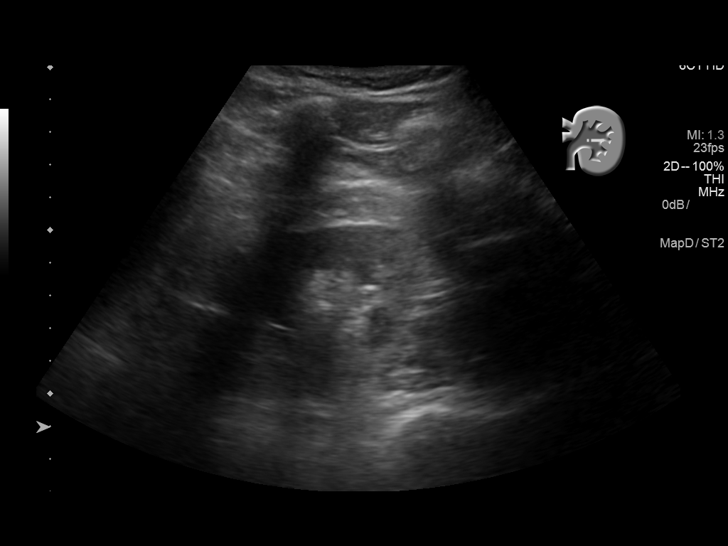
[im 39/47]
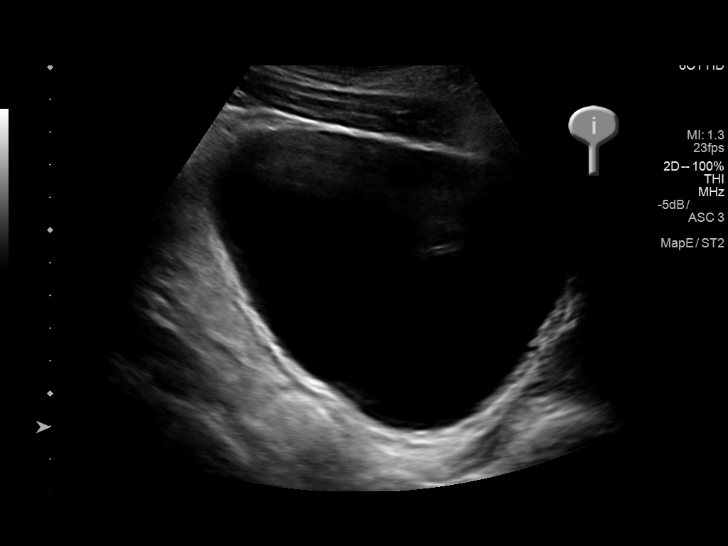
[im 43/47]
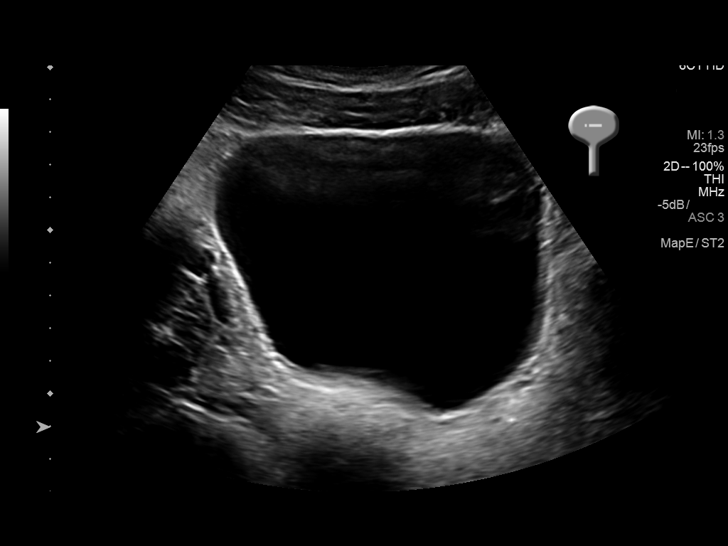
[im 47/47]
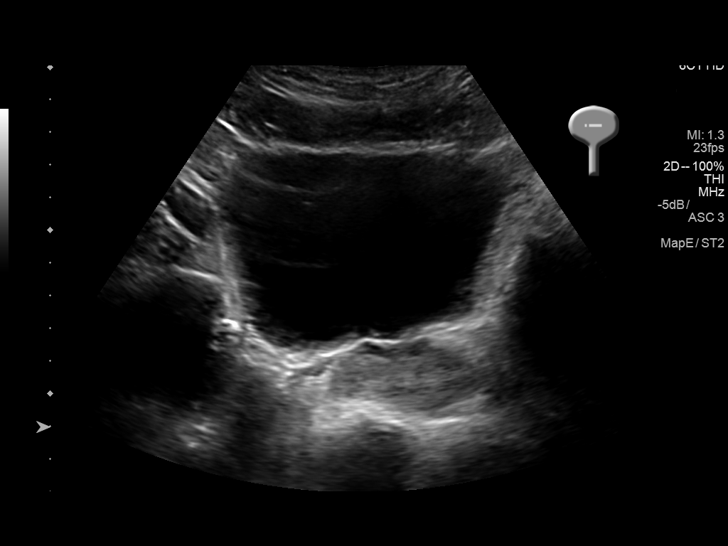

[14 of 25 positions shown; findings below may reference images not displayed]

FINDINGS: Right Kidney:

Length: 11.9 cm. Echogenicity within normal limits. No mass or
hydronephrosis visualized.

Left Kidney:

Length: 12.8 cm. Echogenicity within normal limits. No mass or
hydronephrosis visualized.

Bladder:

Appears normal for degree of bladder distention.
IMPRESSION: No acute or focal abnormality identified. No evidence of
hydronephrosis or hydroureter. Previously identified left
hydronephrosis appears to have resolved.

## 2021-02-22 ENCOUNTER — Emergency Department (HOSPITAL_COMMUNITY): Payer: BC Managed Care – PPO

## 2021-02-22 ENCOUNTER — Other Ambulatory Visit: Payer: Self-pay

## 2021-02-22 ENCOUNTER — Encounter (HOSPITAL_COMMUNITY): Payer: Self-pay | Admitting: Emergency Medicine

## 2021-02-22 ENCOUNTER — Emergency Department (HOSPITAL_COMMUNITY)
Admission: EM | Admit: 2021-02-22 | Discharge: 2021-02-22 | Disposition: A | Discharge status: Home / Self Care | Payer: BC Managed Care – PPO | Attending: Emergency Medicine | Admitting: Emergency Medicine

## 2021-02-22 DIAGNOSIS — S161XXA Strain of muscle, fascia and tendon at neck level, initial encounter: Secondary | ICD-10-CM | POA: Insufficient documentation

## 2021-02-22 DIAGNOSIS — Y92410 Unspecified street and highway as the place of occurrence of the external cause: Secondary | ICD-10-CM | POA: Diagnosis not present

## 2021-02-22 DIAGNOSIS — M545 Low back pain, unspecified: Secondary | ICD-10-CM | POA: Diagnosis not present

## 2021-02-22 DIAGNOSIS — S199XXA Unspecified injury of neck, initial encounter: Secondary | ICD-10-CM | POA: Diagnosis present

## 2021-02-22 MED ORDER — NAPROXEN 500 MG PO TABS: 500.0000 mg | ORAL_TABLET | Freq: Two times a day (BID) | ORAL | 0 refills | Status: DC

## 2021-02-22 MED ORDER — NAPROXEN 500 MG PO TABS: 500.0000 mg | ORAL_TABLET | Freq: Two times a day (BID) | ORAL | 0 refills | Status: AC

## 2021-02-22 MED ORDER — METHOCARBAMOL 500 MG PO TABS: 500.0000 mg | ORAL_TABLET | Freq: Two times a day (BID) | ORAL | 0 refills | Status: AC | PRN

## 2021-02-22 MED ORDER — METHOCARBAMOL 500 MG PO TABS: 500.0000 mg | ORAL_TABLET | Freq: Two times a day (BID) | ORAL | 0 refills | Status: DC | PRN

## 2021-02-22 MED ORDER — KETOROLAC TROMETHAMINE 60 MG/2ML IM SOLN
60.0000 mg | Freq: Once | INTRAMUSCULAR | Status: AC
  Administered 2021-02-22: 60 mg via INTRAMUSCULAR
  Filled 2021-02-22: qty 2
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
## 2021-02-22 NOTE — ED Notes (Signed)
Pt teaching provided on medications that may cause drowsiness. Pt instructed not to drive or operate heavy machinery while taking the prescribed medication. Pt verbalized understanding.  ? ?Pt provided discharge instructions and prescription information. Pt was given the opportunity to ask questions and questions were answered. Discharge signature not obtained in the setting of the COVID-19 pandemic in order to reduce high touch surfaces.  ? ?
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
## 2024-05-13 ENCOUNTER — Telehealth: Payer: Self-pay | Admitting: *Deleted

## 2024-05-13 DIAGNOSIS — Z8279 Family history of other congenital malformations, deformations and chromosomal abnormalities: Secondary | ICD-10-CM

## 2024-05-13 NOTE — Telephone Encounter (Signed)
 Echo ordered for Family Hx Bicuspid Aortic Valve, per Dr. Lorraine. Will need OV (New Patient) righ AFTER Echo.

## 2024-06-13 ENCOUNTER — Ambulatory Visit (HOSPITAL_COMMUNITY)
Admission: RE | Admit: 2024-06-13 | Discharge: 2024-06-13 | Payer: Self-pay | Attending: Cardiovascular Disease | Admitting: Cardiovascular Disease

## 2024-06-13 DIAGNOSIS — Z8279 Family history of other congenital malformations, deformations and chromosomal abnormalities: Secondary | ICD-10-CM

## 2024-06-13 LAB — ECHOCARDIOGRAM COMPLETE
Area-P 1/2: 2.59 cm2
S' Lateral: 2.9 cm

## 2024-06-14 ENCOUNTER — Ambulatory Visit: Payer: Self-pay | Admitting: Internal Medicine

## 2024-06-18 ENCOUNTER — Ambulatory Visit: Admitting: Internal Medicine

## 2024-07-30 ENCOUNTER — Encounter: Payer: Self-pay | Admitting: Internal Medicine

## 2024-07-30 ENCOUNTER — Ambulatory Visit: Payer: Self-pay | Admitting: Internal Medicine

## 2024-07-30 VITALS — BP 118/78 | HR 66 | Ht 72.0 in | Wt 196.4 lb

## 2024-07-30 DIAGNOSIS — I77810 Thoracic aortic ectasia: Secondary | ICD-10-CM

## 2024-07-30 DIAGNOSIS — Z8279 Family history of other congenital malformations, deformations and chromosomal abnormalities: Secondary | ICD-10-CM

## 2024-07-30 NOTE — Patient Instructions (Addendum)
 Medication Instructions:  No Changes  Lab Work: Today: Lipid Panel, Lipoprotein a, CMET  If you have labs (blood work) drawn today and your tests are completely normal, you will receive your results only by: MyChart Message (if you have MyChart) OR A paper copy in the mail If you have any lab test that is abnormal or we need to change your treatment, we will call you to review the results.  Testing/Procedures: Non-Cardiac CT Angiography (CTA) Chest Aorta w/Gating, is a special type of CT scan that uses a computer to produce multi-dimensional views of major blood vessels throughout the body. In CT angiography, a contrast material is injected through an IV to help visualize the blood vessels.   This test will be done at the [facility] at [address] (on [address])   Follow-Up: At [facility], you and your health needs are our priority.  As part of our continuing mission to provide you with exceptional heart care, our providers are all part of one team.  This team includes your primary Cardiologist (physician) and Advanced Practice Providers or APPs (Physician Assistants and Nurse Practitioners) who all work together to provide you with the care you need, when you need it.  Your next appointment:   1 year(s)  Provider:   Soyla DELENA Merck, MD    We recommend signing up for the patient portal called MyChart.  Sign up information is provided on this After Visit Summary.  MyChart is used to connect with patients for Virtual Visits (Telemedicine).  Patients are able to view lab/test results, encounter notes, upcoming appointments, etc.  Non-urgent messages can be sent to your provider as well.   To learn more about what you can do with MyChart, go to [email or web address].

## 2024-07-30 NOTE — Progress Notes (Unsigned)
" °  Cardiology Office Note:  .   Date:  07/30/2024  ID:  Selinda JONELLE Demark, DOB [birth year], MRN [id number] PCP: Nena Rosina LITTIE DEVONNA  [facility] Providers Cardiologist:  None    History of Present Illness: .   Dalton Estrada is a 44 y.o. male.  Discussed the use of AI scribe software for clinical note transcription with the patient, who gave verbal consent to proceed.  History of Present Illness     ROS: negative except per HPI above.  Studies Reviewed: .        Results  Risk Assessment/Calculations:   {Does this patient have ATRIAL FIBRILLATION?[phone]}   Physical Exam:   VS:  BP 118/78 (BP Location: Left Arm, Patient Position: Sitting)   Pulse 66   Ht 6' (1.829 m)   Wt 196 lb 6.4 oz (89.1 kg)   SpO2 98%   BMI 26.64 kg/m    Wt Readings from Last 3 Encounters:  07/30/24 196 lb 6.4 oz (89.1 kg)  02/22/21 165 lb (74.8 kg)  12/15/16 165 lb (74.8 kg)     Physical Exam    ASSESSMENT AND PLAN: .    Assessment and Plan Assessment & Plan       Soyla Merck, MD, [facility] "

## 2024-08-01 ENCOUNTER — Ambulatory Visit (HOSPITAL_COMMUNITY): Admission: RE | Admit: 2024-08-01 | Discharge: 2024-08-01 | Payer: Self-pay | Attending: Internal Medicine

## 2024-08-01 DIAGNOSIS — I77810 Thoracic aortic ectasia: Secondary | ICD-10-CM

## 2024-08-01 MED ORDER — IOHEXOL 350 MG/ML SOLN
75.0000 mL | Freq: Once | INTRAVENOUS | Status: AC | PRN
  Administered 2024-08-01: 75 mL via INTRAVENOUS

## 2024-08-02 ENCOUNTER — Ambulatory Visit: Payer: Self-pay | Admitting: Internal Medicine

## 2024-08-02 LAB — COMPREHENSIVE METABOLIC PANEL WITH GFR
ALT: 23 [IU]/L (ref 0–44)
AST: 19 [IU]/L (ref 0–40)
Albumin: 4.5 g/dL (ref 4.1–5.1)
Alkaline Phosphatase: 81 [IU]/L (ref 47–123)
BUN/Creatinine Ratio: 18 (ref 9–20)
BUN: 19 mg/dL (ref 6–24)
Bilirubin Total: 0.8 mg/dL (ref 0.0–1.2)
CO2: 22 mmol/L (ref 20–29)
Calcium: 9.4 mg/dL (ref 8.7–10.2)
Chloride: 106 mmol/L (ref 96–106)
Creatinine, Ser: 1.08 mg/dL (ref 0.76–1.27)
Globulin, Total: 2 g/dL (ref 1.5–4.5)
Glucose: 95 mg/dL (ref 70–99)
Potassium: 5.2 mmol/L (ref 3.5–5.2)
Sodium: 140 mmol/L (ref 134–144)
Total Protein: 6.5 g/dL (ref 6.0–8.5)
eGFR: 88 mL/min/{1.73_m2}

## 2024-08-02 LAB — LIPID PANEL
Chol/HDL Ratio: 5 ratio (ref 0.0–5.0)
Cholesterol, Total: 156 mg/dL (ref 100–199)
HDL: 31 mg/dL — ABNORMAL LOW
LDL Chol Calc (NIH): 108 mg/dL — ABNORMAL HIGH (ref 0–99)
Triglycerides: 87 mg/dL (ref 0–149)
VLDL Cholesterol Cal: 17 mg/dL (ref 5–40)

## 2024-08-02 LAB — LIPOPROTEIN A (LPA): Lipoprotein (a): <8.4 nmol/L
